# Patient Record
Sex: Female | Born: 1945 | Race: White | Hispanic: No | Marital: Married | Smoking: Never smoker
Health system: Southern US, Community
[De-identification: ages and names within clinical notes are randomized; demographics above are authoritative.]

## PROBLEM LIST (undated history)

## (undated) DIAGNOSIS — I82409 Acute embolism and thrombosis of unspecified deep veins of unspecified lower extremity: Secondary | ICD-10-CM

---

## 1997-11-13 ENCOUNTER — Other Ambulatory Visit: Admission: RE | Admit: 1997-11-13 | Discharge: 1997-11-13 | Payer: Self-pay | Admitting: Obstetrics & Gynecology

## 1997-12-06 ENCOUNTER — Ambulatory Visit (HOSPITAL_COMMUNITY): Admission: RE | Admit: 1997-12-06 | Discharge: 1997-12-06 | Payer: Self-pay | Admitting: Obstetrics & Gynecology

## 1997-12-06 ENCOUNTER — Encounter: Payer: Self-pay | Admitting: Obstetrics & Gynecology

## 1997-12-13 ENCOUNTER — Encounter: Payer: Self-pay | Admitting: Obstetrics & Gynecology

## 1997-12-13 ENCOUNTER — Ambulatory Visit (HOSPITAL_COMMUNITY): Admission: RE | Admit: 1997-12-13 | Discharge: 1997-12-13 | Payer: Self-pay | Admitting: Obstetrics & Gynecology

## 1998-10-24 ENCOUNTER — Ambulatory Visit (HOSPITAL_COMMUNITY): Admission: RE | Admit: 1998-10-24 | Discharge: 1998-10-24 | Payer: Self-pay | Admitting: Obstetrics & Gynecology

## 1998-11-18 ENCOUNTER — Other Ambulatory Visit: Admission: RE | Admit: 1998-11-18 | Discharge: 1998-11-18 | Payer: Self-pay | Admitting: Obstetrics & Gynecology

## 2000-01-04 ENCOUNTER — Other Ambulatory Visit: Admission: RE | Admit: 2000-01-04 | Discharge: 2000-01-04 | Payer: Self-pay | Admitting: Obstetrics & Gynecology

## 2003-02-19 ENCOUNTER — Other Ambulatory Visit: Admission: RE | Admit: 2003-02-19 | Discharge: 2003-02-19 | Payer: Self-pay | Admitting: Obstetrics & Gynecology

## 2003-02-20 ENCOUNTER — Encounter: Admission: RE | Admit: 2003-02-20 | Discharge: 2003-02-20 | Payer: Self-pay | Admitting: Geriatric Medicine

## 2003-03-20 ENCOUNTER — Encounter: Admission: RE | Admit: 2003-03-20 | Discharge: 2003-03-20 | Payer: Self-pay | Admitting: Geriatric Medicine

## 2003-12-25 ENCOUNTER — Ambulatory Visit (HOSPITAL_COMMUNITY): Admission: RE | Admit: 2003-12-25 | Discharge: 2003-12-25 | Payer: Self-pay | Admitting: Gastroenterology

## 2004-04-07 ENCOUNTER — Other Ambulatory Visit: Admission: RE | Admit: 2004-04-07 | Discharge: 2004-04-07 | Payer: Self-pay | Admitting: Obstetrics & Gynecology

## 2010-03-30 ENCOUNTER — Ambulatory Visit: Payer: BC Managed Care – PPO | Admitting: Physical Therapy

## 2010-03-31 ENCOUNTER — Encounter: Payer: Self-pay | Admitting: Physical Therapy

## 2010-04-02 ENCOUNTER — Encounter: Payer: Self-pay | Admitting: Physical Therapy

## 2013-06-21 ENCOUNTER — Other Ambulatory Visit: Payer: Self-pay | Admitting: Dermatology

## 2013-06-22 ENCOUNTER — Other Ambulatory Visit: Payer: Self-pay | Admitting: Geriatric Medicine

## 2013-06-22 ENCOUNTER — Ambulatory Visit
Admission: RE | Admit: 2013-06-22 | Discharge: 2013-06-22 | Disposition: A | Discharge status: Home / Self Care | Payer: Medicare Other | Source: Ambulatory Visit | Attending: Geriatric Medicine | Admitting: Geriatric Medicine

## 2013-06-22 DIAGNOSIS — M542 Cervicalgia: Secondary | ICD-10-CM

## 2013-06-22 DIAGNOSIS — R51 Headache: Secondary | ICD-10-CM

## 2013-07-03 ENCOUNTER — Ambulatory Visit
Admission: RE | Admit: 2013-07-03 | Discharge: 2013-07-03 | Disposition: A | Discharge status: Home / Self Care | Payer: Medicare Other | Source: Ambulatory Visit | Attending: Geriatric Medicine | Admitting: Geriatric Medicine

## 2013-07-03 DIAGNOSIS — R51 Headache: Secondary | ICD-10-CM

## 2013-07-03 MED ORDER — GADOBENATE DIMEGLUMINE 529 MG/ML IV SOLN
14.0000 mL | Freq: Once | INTRAVENOUS | Status: AC | PRN
  Administered 2013-07-03: 14 mL via INTRAVENOUS

## 2014-02-14 DIAGNOSIS — H93291 Other abnormal auditory perceptions, right ear: Secondary | ICD-10-CM | POA: Diagnosis not present

## 2014-02-14 DIAGNOSIS — H9311 Tinnitus, right ear: Secondary | ICD-10-CM | POA: Diagnosis not present

## 2014-02-14 DIAGNOSIS — J029 Acute pharyngitis, unspecified: Secondary | ICD-10-CM | POA: Diagnosis not present

## 2014-03-14 ENCOUNTER — Other Ambulatory Visit: Payer: Self-pay | Admitting: Obstetrics & Gynecology

## 2014-03-14 DIAGNOSIS — Z124 Encounter for screening for malignant neoplasm of cervix: Secondary | ICD-10-CM | POA: Diagnosis not present

## 2014-03-14 DIAGNOSIS — Z6824 Body mass index (BMI) 24.0-24.9, adult: Secondary | ICD-10-CM | POA: Diagnosis not present

## 2014-03-14 DIAGNOSIS — Z01419 Encounter for gynecological examination (general) (routine) without abnormal findings: Secondary | ICD-10-CM | POA: Diagnosis not present

## 2014-03-14 DIAGNOSIS — Z1231 Encounter for screening mammogram for malignant neoplasm of breast: Secondary | ICD-10-CM | POA: Diagnosis not present

## 2014-03-18 LAB — CYTOLOGY - PAP

## 2014-12-31 DIAGNOSIS — N858 Other specified noninflammatory disorders of uterus: Secondary | ICD-10-CM | POA: Diagnosis not present

## 2014-12-31 DIAGNOSIS — Z13820 Encounter for screening for osteoporosis: Secondary | ICD-10-CM | POA: Diagnosis not present

## 2014-12-31 DIAGNOSIS — Q782 Osteopetrosis: Secondary | ICD-10-CM | POA: Diagnosis not present

## 2014-12-31 DIAGNOSIS — E559 Vitamin D deficiency, unspecified: Secondary | ICD-10-CM | POA: Diagnosis not present

## 2014-12-31 DIAGNOSIS — N958 Other specified menopausal and perimenopausal disorders: Secondary | ICD-10-CM | POA: Diagnosis not present

## 2014-12-31 DIAGNOSIS — M818 Other osteoporosis without current pathological fracture: Secondary | ICD-10-CM | POA: Diagnosis not present

## 2015-01-31 DIAGNOSIS — Z23 Encounter for immunization: Secondary | ICD-10-CM | POA: Diagnosis not present

## 2015-03-09 DIAGNOSIS — M549 Dorsalgia, unspecified: Secondary | ICD-10-CM | POA: Diagnosis not present

## 2015-03-09 DIAGNOSIS — N159 Renal tubulo-interstitial disease, unspecified: Secondary | ICD-10-CM | POA: Diagnosis not present

## 2015-03-20 DIAGNOSIS — M810 Age-related osteoporosis without current pathological fracture: Secondary | ICD-10-CM | POA: Diagnosis not present

## 2015-03-20 DIAGNOSIS — Z1231 Encounter for screening mammogram for malignant neoplasm of breast: Secondary | ICD-10-CM | POA: Diagnosis not present

## 2015-03-26 ENCOUNTER — Other Ambulatory Visit: Payer: Self-pay | Admitting: Obstetrics & Gynecology

## 2015-03-26 DIAGNOSIS — R928 Other abnormal and inconclusive findings on diagnostic imaging of breast: Secondary | ICD-10-CM

## 2015-04-03 ENCOUNTER — Ambulatory Visit
Admission: RE | Admit: 2015-04-03 | Discharge: 2015-04-03 | Disposition: A | Discharge status: Home / Self Care | Payer: Medicare Other | Source: Ambulatory Visit | Attending: Obstetrics & Gynecology | Admitting: Obstetrics & Gynecology

## 2015-04-03 DIAGNOSIS — R921 Mammographic calcification found on diagnostic imaging of breast: Secondary | ICD-10-CM | POA: Diagnosis not present

## 2015-04-03 DIAGNOSIS — R928 Other abnormal and inconclusive findings on diagnostic imaging of breast: Secondary | ICD-10-CM

## 2015-06-18 ENCOUNTER — Other Ambulatory Visit: Payer: Self-pay | Admitting: Occupational Medicine

## 2015-06-18 ENCOUNTER — Ambulatory Visit: Payer: Self-pay

## 2015-06-18 DIAGNOSIS — M25521 Pain in right elbow: Secondary | ICD-10-CM

## 2015-07-28 DIAGNOSIS — R21 Rash and other nonspecific skin eruption: Secondary | ICD-10-CM | POA: Diagnosis not present

## 2015-07-28 DIAGNOSIS — L539 Erythematous condition, unspecified: Secondary | ICD-10-CM | POA: Diagnosis not present

## 2015-08-14 DIAGNOSIS — L989 Disorder of the skin and subcutaneous tissue, unspecified: Secondary | ICD-10-CM | POA: Diagnosis not present

## 2015-09-03 DIAGNOSIS — L814 Other melanin hyperpigmentation: Secondary | ICD-10-CM | POA: Diagnosis not present

## 2015-09-03 DIAGNOSIS — L812 Freckles: Secondary | ICD-10-CM | POA: Diagnosis not present

## 2015-09-03 DIAGNOSIS — L550 Sunburn of first degree: Secondary | ICD-10-CM | POA: Diagnosis not present

## 2015-09-03 DIAGNOSIS — L700 Acne vulgaris: Secondary | ICD-10-CM | POA: Diagnosis not present

## 2015-09-03 DIAGNOSIS — L821 Other seborrheic keratosis: Secondary | ICD-10-CM | POA: Diagnosis not present

## 2015-09-03 DIAGNOSIS — L570 Actinic keratosis: Secondary | ICD-10-CM | POA: Diagnosis not present

## 2015-09-03 DIAGNOSIS — L309 Dermatitis, unspecified: Secondary | ICD-10-CM | POA: Diagnosis not present

## 2015-09-03 DIAGNOSIS — L820 Inflamed seborrheic keratosis: Secondary | ICD-10-CM | POA: Diagnosis not present

## 2015-10-07 DIAGNOSIS — J3089 Other allergic rhinitis: Secondary | ICD-10-CM | POA: Diagnosis not present

## 2015-10-07 DIAGNOSIS — R0982 Postnasal drip: Secondary | ICD-10-CM | POA: Diagnosis not present

## 2015-12-17 DIAGNOSIS — Z23 Encounter for immunization: Secondary | ICD-10-CM | POA: Diagnosis not present

## 2015-12-17 DIAGNOSIS — T753XXA Motion sickness, initial encounter: Secondary | ICD-10-CM | POA: Diagnosis not present

## 2015-12-17 DIAGNOSIS — F411 Generalized anxiety disorder: Secondary | ICD-10-CM | POA: Diagnosis not present

## 2015-12-17 DIAGNOSIS — J301 Allergic rhinitis due to pollen: Secondary | ICD-10-CM | POA: Diagnosis not present

## 2017-02-14 DIAGNOSIS — Z23 Encounter for immunization: Secondary | ICD-10-CM | POA: Diagnosis not present

## 2017-03-28 ENCOUNTER — Ambulatory Visit: Payer: Medicare Other | Admitting: Nurse Practitioner

## 2017-03-28 DIAGNOSIS — R5383 Other fatigue: Secondary | ICD-10-CM | POA: Diagnosis not present

## 2017-03-28 DIAGNOSIS — J029 Acute pharyngitis, unspecified: Secondary | ICD-10-CM | POA: Diagnosis not present

## 2017-03-28 DIAGNOSIS — R51 Headache: Secondary | ICD-10-CM | POA: Diagnosis not present

## 2017-03-28 DIAGNOSIS — J309 Allergic rhinitis, unspecified: Secondary | ICD-10-CM | POA: Diagnosis not present

## 2017-07-06 ENCOUNTER — Other Ambulatory Visit: Payer: Self-pay | Admitting: Obstetrics & Gynecology

## 2017-07-06 DIAGNOSIS — Z1231 Encounter for screening mammogram for malignant neoplasm of breast: Secondary | ICD-10-CM

## 2017-08-04 ENCOUNTER — Ambulatory Visit
Admission: RE | Admit: 2017-08-04 | Discharge: 2017-08-04 | Disposition: A | Discharge status: Home / Self Care | Payer: Medicare Other | Source: Ambulatory Visit | Attending: Obstetrics & Gynecology | Admitting: Obstetrics & Gynecology

## 2017-08-04 DIAGNOSIS — Z1231 Encounter for screening mammogram for malignant neoplasm of breast: Secondary | ICD-10-CM

## 2017-08-05 DIAGNOSIS — D1801 Hemangioma of skin and subcutaneous tissue: Secondary | ICD-10-CM | POA: Diagnosis not present

## 2017-08-05 DIAGNOSIS — L400 Psoriasis vulgaris: Secondary | ICD-10-CM | POA: Diagnosis not present

## 2017-08-05 DIAGNOSIS — D485 Neoplasm of uncertain behavior of skin: Secondary | ICD-10-CM | POA: Diagnosis not present

## 2017-08-05 DIAGNOSIS — L821 Other seborrheic keratosis: Secondary | ICD-10-CM | POA: Diagnosis not present

## 2017-08-05 DIAGNOSIS — L814 Other melanin hyperpigmentation: Secondary | ICD-10-CM | POA: Diagnosis not present

## 2017-08-05 DIAGNOSIS — L570 Actinic keratosis: Secondary | ICD-10-CM | POA: Diagnosis not present

## 2017-08-09 DIAGNOSIS — Z6823 Body mass index (BMI) 23.0-23.9, adult: Secondary | ICD-10-CM | POA: Diagnosis not present

## 2017-08-09 DIAGNOSIS — Z124 Encounter for screening for malignant neoplasm of cervix: Secondary | ICD-10-CM | POA: Diagnosis not present

## 2018-01-07 DIAGNOSIS — Z23 Encounter for immunization: Secondary | ICD-10-CM | POA: Diagnosis not present

## 2018-01-24 DIAGNOSIS — Z13820 Encounter for screening for osteoporosis: Secondary | ICD-10-CM | POA: Diagnosis not present

## 2018-01-24 DIAGNOSIS — N858 Other specified noninflammatory disorders of uterus: Secondary | ICD-10-CM | POA: Diagnosis not present

## 2018-01-24 DIAGNOSIS — N958 Other specified menopausal and perimenopausal disorders: Secondary | ICD-10-CM | POA: Diagnosis not present

## 2018-01-24 DIAGNOSIS — M816 Localized osteoporosis [Lequesne]: Secondary | ICD-10-CM | POA: Diagnosis not present

## 2018-01-27 DIAGNOSIS — H2513 Age-related nuclear cataract, bilateral: Secondary | ICD-10-CM | POA: Diagnosis not present

## 2018-01-27 DIAGNOSIS — H5203 Hypermetropia, bilateral: Secondary | ICD-10-CM | POA: Diagnosis not present

## 2018-07-19 ENCOUNTER — Other Ambulatory Visit: Payer: Self-pay | Admitting: Obstetrics & Gynecology

## 2018-07-19 DIAGNOSIS — Z0100 Encounter for examination of eyes and vision without abnormal findings: Secondary | ICD-10-CM | POA: Diagnosis not present

## 2018-07-19 DIAGNOSIS — Z1231 Encounter for screening mammogram for malignant neoplasm of breast: Secondary | ICD-10-CM

## 2018-07-21 DIAGNOSIS — R69 Illness, unspecified: Secondary | ICD-10-CM | POA: Diagnosis not present

## 2018-08-03 DIAGNOSIS — R69 Illness, unspecified: Secondary | ICD-10-CM | POA: Diagnosis not present

## 2018-09-01 ENCOUNTER — Ambulatory Visit
Admission: RE | Admit: 2018-09-01 | Discharge: 2018-09-01 | Disposition: A | Discharge status: Home / Self Care | Payer: Medicare HMO | Source: Ambulatory Visit | Attending: Obstetrics & Gynecology | Admitting: Obstetrics & Gynecology

## 2018-09-01 ENCOUNTER — Other Ambulatory Visit: Payer: Self-pay

## 2018-09-01 DIAGNOSIS — Z1231 Encounter for screening mammogram for malignant neoplasm of breast: Secondary | ICD-10-CM

## 2018-09-05 ENCOUNTER — Other Ambulatory Visit: Payer: Self-pay | Admitting: Obstetrics & Gynecology

## 2018-09-05 DIAGNOSIS — R928 Other abnormal and inconclusive findings on diagnostic imaging of breast: Secondary | ICD-10-CM

## 2018-09-08 ENCOUNTER — Other Ambulatory Visit: Payer: Self-pay

## 2018-09-08 ENCOUNTER — Ambulatory Visit: Payer: Medicare HMO

## 2018-09-08 ENCOUNTER — Ambulatory Visit
Admission: RE | Admit: 2018-09-08 | Discharge: 2018-09-08 | Disposition: A | Discharge status: Home / Self Care | Payer: Medicare HMO | Source: Ambulatory Visit | Attending: Obstetrics & Gynecology | Admitting: Obstetrics & Gynecology

## 2018-09-08 DIAGNOSIS — R928 Other abnormal and inconclusive findings on diagnostic imaging of breast: Secondary | ICD-10-CM

## 2018-09-08 DIAGNOSIS — R922 Inconclusive mammogram: Secondary | ICD-10-CM | POA: Diagnosis not present

## 2018-11-22 ENCOUNTER — Ambulatory Visit
Admission: RE | Admit: 2018-11-22 | Discharge: 2018-11-22 | Disposition: A | Discharge status: Home / Self Care | Payer: Medicare HMO | Source: Ambulatory Visit | Attending: Geriatric Medicine | Admitting: Geriatric Medicine

## 2018-11-22 ENCOUNTER — Other Ambulatory Visit: Payer: Self-pay | Admitting: Geriatric Medicine

## 2018-11-22 DIAGNOSIS — M79671 Pain in right foot: Secondary | ICD-10-CM

## 2018-11-22 DIAGNOSIS — D369 Benign neoplasm, unspecified site: Secondary | ICD-10-CM | POA: Diagnosis not present

## 2018-11-22 DIAGNOSIS — W5501XA Bitten by cat, initial encounter: Secondary | ICD-10-CM | POA: Diagnosis not present

## 2018-11-22 DIAGNOSIS — S61259A Open bite of unspecified finger without damage to nail, initial encounter: Secondary | ICD-10-CM | POA: Diagnosis not present

## 2018-11-22 DIAGNOSIS — M810 Age-related osteoporosis without current pathological fracture: Secondary | ICD-10-CM | POA: Diagnosis not present

## 2018-11-22 DIAGNOSIS — Z23 Encounter for immunization: Secondary | ICD-10-CM | POA: Diagnosis not present

## 2019-01-29 DIAGNOSIS — Z780 Asymptomatic menopausal state: Secondary | ICD-10-CM | POA: Diagnosis not present

## 2019-01-29 DIAGNOSIS — M816 Localized osteoporosis [Lequesne]: Secondary | ICD-10-CM | POA: Diagnosis not present

## 2019-07-05 DIAGNOSIS — H2513 Age-related nuclear cataract, bilateral: Secondary | ICD-10-CM | POA: Diagnosis not present

## 2019-07-05 DIAGNOSIS — H524 Presbyopia: Secondary | ICD-10-CM | POA: Diagnosis not present

## 2019-07-09 DIAGNOSIS — R102 Pelvic and perineal pain: Secondary | ICD-10-CM | POA: Diagnosis not present

## 2019-07-09 DIAGNOSIS — R3982 Chronic bladder pain: Secondary | ICD-10-CM | POA: Diagnosis not present

## 2019-07-09 DIAGNOSIS — N841 Polyp of cervix uteri: Secondary | ICD-10-CM | POA: Diagnosis not present

## 2019-07-10 DIAGNOSIS — M810 Age-related osteoporosis without current pathological fracture: Secondary | ICD-10-CM | POA: Diagnosis not present

## 2019-07-10 DIAGNOSIS — Z0000 Encounter for general adult medical examination without abnormal findings: Secondary | ICD-10-CM | POA: Diagnosis not present

## 2019-07-10 DIAGNOSIS — Z1389 Encounter for screening for other disorder: Secondary | ICD-10-CM | POA: Diagnosis not present

## 2019-07-10 DIAGNOSIS — Z79899 Other long term (current) drug therapy: Secondary | ICD-10-CM | POA: Diagnosis not present

## 2019-07-10 DIAGNOSIS — Z13220 Encounter for screening for lipoid disorders: Secondary | ICD-10-CM | POA: Diagnosis not present

## 2019-07-10 DIAGNOSIS — R5383 Other fatigue: Secondary | ICD-10-CM | POA: Diagnosis not present

## 2019-07-12 DIAGNOSIS — Z1159 Encounter for screening for other viral diseases: Secondary | ICD-10-CM | POA: Diagnosis not present

## 2019-08-06 ENCOUNTER — Other Ambulatory Visit: Payer: Self-pay | Admitting: Obstetrics & Gynecology

## 2019-08-06 DIAGNOSIS — Z1231 Encounter for screening mammogram for malignant neoplasm of breast: Secondary | ICD-10-CM

## 2019-09-03 DIAGNOSIS — R3982 Chronic bladder pain: Secondary | ICD-10-CM | POA: Diagnosis not present

## 2019-09-03 DIAGNOSIS — Z124 Encounter for screening for malignant neoplasm of cervix: Secondary | ICD-10-CM | POA: Diagnosis not present

## 2019-09-03 DIAGNOSIS — Z6823 Body mass index (BMI) 23.0-23.9, adult: Secondary | ICD-10-CM | POA: Diagnosis not present

## 2019-09-10 ENCOUNTER — Ambulatory Visit
Admission: RE | Admit: 2019-09-10 | Discharge: 2019-09-10 | Disposition: A | Discharge status: Home / Self Care | Payer: Medicare Other | Source: Ambulatory Visit | Attending: Obstetrics & Gynecology | Admitting: Obstetrics & Gynecology

## 2019-09-10 ENCOUNTER — Other Ambulatory Visit: Payer: Self-pay

## 2019-09-10 DIAGNOSIS — Z1231 Encounter for screening mammogram for malignant neoplasm of breast: Secondary | ICD-10-CM | POA: Diagnosis not present

## 2019-11-20 DIAGNOSIS — Z23 Encounter for immunization: Secondary | ICD-10-CM | POA: Diagnosis not present

## 2020-06-16 DIAGNOSIS — L989 Disorder of the skin and subcutaneous tissue, unspecified: Secondary | ICD-10-CM | POA: Diagnosis not present

## 2020-07-03 DIAGNOSIS — M816 Localized osteoporosis [Lequesne]: Secondary | ICD-10-CM | POA: Diagnosis not present

## 2020-07-14 DIAGNOSIS — U071 COVID-19: Secondary | ICD-10-CM | POA: Diagnosis not present

## 2020-07-14 DIAGNOSIS — J069 Acute upper respiratory infection, unspecified: Secondary | ICD-10-CM | POA: Diagnosis not present

## 2020-07-23 DIAGNOSIS — Z79899 Other long term (current) drug therapy: Secondary | ICD-10-CM | POA: Diagnosis not present

## 2020-07-23 DIAGNOSIS — M8588 Other specified disorders of bone density and structure, other site: Secondary | ICD-10-CM | POA: Diagnosis not present

## 2020-07-31 DIAGNOSIS — H2513 Age-related nuclear cataract, bilateral: Secondary | ICD-10-CM | POA: Diagnosis not present

## 2020-07-31 DIAGNOSIS — H04123 Dry eye syndrome of bilateral lacrimal glands: Secondary | ICD-10-CM | POA: Diagnosis not present

## 2020-07-31 DIAGNOSIS — H5203 Hypermetropia, bilateral: Secondary | ICD-10-CM | POA: Diagnosis not present

## 2020-08-07 ENCOUNTER — Other Ambulatory Visit: Payer: Self-pay | Admitting: Obstetrics & Gynecology

## 2020-08-07 DIAGNOSIS — Z1231 Encounter for screening mammogram for malignant neoplasm of breast: Secondary | ICD-10-CM

## 2020-08-19 DIAGNOSIS — D1801 Hemangioma of skin and subcutaneous tissue: Secondary | ICD-10-CM | POA: Diagnosis not present

## 2020-08-19 DIAGNOSIS — D3613 Benign neoplasm of peripheral nerves and autonomic nervous system of lower limb, including hip: Secondary | ICD-10-CM | POA: Diagnosis not present

## 2020-08-19 DIAGNOSIS — L570 Actinic keratosis: Secondary | ICD-10-CM | POA: Diagnosis not present

## 2020-08-19 DIAGNOSIS — L309 Dermatitis, unspecified: Secondary | ICD-10-CM | POA: Diagnosis not present

## 2020-08-19 DIAGNOSIS — L814 Other melanin hyperpigmentation: Secondary | ICD-10-CM | POA: Diagnosis not present

## 2020-08-19 DIAGNOSIS — L821 Other seborrheic keratosis: Secondary | ICD-10-CM | POA: Diagnosis not present

## 2020-09-02 DIAGNOSIS — N644 Mastodynia: Secondary | ICD-10-CM | POA: Diagnosis not present

## 2020-09-02 DIAGNOSIS — F5104 Psychophysiologic insomnia: Secondary | ICD-10-CM | POA: Diagnosis not present

## 2020-09-02 DIAGNOSIS — Z79899 Other long term (current) drug therapy: Secondary | ICD-10-CM | POA: Diagnosis not present

## 2020-09-02 DIAGNOSIS — M810 Age-related osteoporosis without current pathological fracture: Secondary | ICD-10-CM | POA: Diagnosis not present

## 2020-09-02 DIAGNOSIS — Z0000 Encounter for general adult medical examination without abnormal findings: Secondary | ICD-10-CM | POA: Diagnosis not present

## 2020-09-02 DIAGNOSIS — Z136 Encounter for screening for cardiovascular disorders: Secondary | ICD-10-CM | POA: Diagnosis not present

## 2020-09-04 ENCOUNTER — Other Ambulatory Visit: Payer: Self-pay | Admitting: Geriatric Medicine

## 2020-09-04 DIAGNOSIS — N644 Mastodynia: Secondary | ICD-10-CM

## 2020-09-10 ENCOUNTER — Other Ambulatory Visit: Payer: Self-pay | Admitting: Geriatric Medicine

## 2020-09-10 DIAGNOSIS — N644 Mastodynia: Secondary | ICD-10-CM

## 2020-09-15 ENCOUNTER — Other Ambulatory Visit: Payer: Self-pay

## 2020-09-15 ENCOUNTER — Ambulatory Visit: Payer: Medicare Other

## 2020-09-15 ENCOUNTER — Ambulatory Visit
Admission: RE | Admit: 2020-09-15 | Discharge: 2020-09-15 | Disposition: A | Discharge status: Home / Self Care | Payer: Medicare Other | Source: Ambulatory Visit | Attending: Obstetrics & Gynecology | Admitting: Obstetrics & Gynecology

## 2020-09-15 DIAGNOSIS — R922 Inconclusive mammogram: Secondary | ICD-10-CM | POA: Diagnosis not present

## 2020-09-15 DIAGNOSIS — N644 Mastodynia: Secondary | ICD-10-CM

## 2020-10-02 ENCOUNTER — Ambulatory Visit: Payer: Medicare Other

## 2020-11-22 DIAGNOSIS — J069 Acute upper respiratory infection, unspecified: Secondary | ICD-10-CM | POA: Diagnosis not present

## 2020-11-22 DIAGNOSIS — R509 Fever, unspecified: Secondary | ICD-10-CM | POA: Diagnosis not present

## 2020-11-22 DIAGNOSIS — R059 Cough, unspecified: Secondary | ICD-10-CM | POA: Diagnosis not present

## 2020-11-22 DIAGNOSIS — Z03818 Encounter for observation for suspected exposure to other biological agents ruled out: Secondary | ICD-10-CM | POA: Diagnosis not present

## 2020-11-22 DIAGNOSIS — J101 Influenza due to other identified influenza virus with other respiratory manifestations: Secondary | ICD-10-CM | POA: Diagnosis not present

## 2021-01-09 ENCOUNTER — Other Ambulatory Visit: Payer: Self-pay | Admitting: Geriatric Medicine

## 2021-01-09 DIAGNOSIS — M810 Age-related osteoporosis without current pathological fracture: Secondary | ICD-10-CM

## 2021-01-09 DIAGNOSIS — R5383 Other fatigue: Secondary | ICD-10-CM | POA: Diagnosis not present

## 2021-01-09 DIAGNOSIS — Z79899 Other long term (current) drug therapy: Secondary | ICD-10-CM | POA: Diagnosis not present

## 2021-01-09 DIAGNOSIS — R052 Subacute cough: Secondary | ICD-10-CM | POA: Diagnosis not present

## 2021-01-09 DIAGNOSIS — D649 Anemia, unspecified: Secondary | ICD-10-CM | POA: Diagnosis not present

## 2021-01-09 DIAGNOSIS — M79605 Pain in left leg: Secondary | ICD-10-CM | POA: Diagnosis not present

## 2021-01-12 ENCOUNTER — Other Ambulatory Visit: Payer: Medicare Other

## 2021-01-12 DIAGNOSIS — H04123 Dry eye syndrome of bilateral lacrimal glands: Secondary | ICD-10-CM | POA: Diagnosis not present

## 2021-01-13 DIAGNOSIS — D508 Other iron deficiency anemias: Secondary | ICD-10-CM | POA: Diagnosis not present

## 2021-01-15 ENCOUNTER — Ambulatory Visit
Admission: RE | Admit: 2021-01-15 | Discharge: 2021-01-15 | Disposition: A | Discharge status: Home / Self Care | Payer: Medicare Other | Source: Ambulatory Visit | Attending: Geriatric Medicine | Admitting: Geriatric Medicine

## 2021-01-15 DIAGNOSIS — I82812 Embolism and thrombosis of superficial veins of left lower extremities: Secondary | ICD-10-CM | POA: Diagnosis not present

## 2021-01-15 DIAGNOSIS — M810 Age-related osteoporosis without current pathological fracture: Secondary | ICD-10-CM

## 2021-01-27 DIAGNOSIS — K2930 Chronic superficial gastritis without bleeding: Secondary | ICD-10-CM | POA: Diagnosis not present

## 2021-01-27 DIAGNOSIS — D509 Iron deficiency anemia, unspecified: Secondary | ICD-10-CM | POA: Diagnosis not present

## 2021-01-27 DIAGNOSIS — K222 Esophageal obstruction: Secondary | ICD-10-CM | POA: Diagnosis not present

## 2021-01-27 DIAGNOSIS — R1013 Epigastric pain: Secondary | ICD-10-CM | POA: Diagnosis not present

## 2021-01-27 DIAGNOSIS — K449 Diaphragmatic hernia without obstruction or gangrene: Secondary | ICD-10-CM | POA: Diagnosis not present

## 2021-01-30 ENCOUNTER — Other Ambulatory Visit: Payer: Self-pay | Admitting: Gastroenterology

## 2021-01-30 DIAGNOSIS — R1084 Generalized abdominal pain: Secondary | ICD-10-CM

## 2021-01-30 DIAGNOSIS — D508 Other iron deficiency anemias: Secondary | ICD-10-CM

## 2021-01-30 DIAGNOSIS — R194 Change in bowel habit: Secondary | ICD-10-CM

## 2021-02-02 ENCOUNTER — Other Ambulatory Visit: Payer: Self-pay | Admitting: Geriatric Medicine

## 2021-02-02 DIAGNOSIS — M79605 Pain in left leg: Secondary | ICD-10-CM

## 2021-02-02 DIAGNOSIS — F411 Generalized anxiety disorder: Secondary | ICD-10-CM | POA: Diagnosis not present

## 2021-02-03 ENCOUNTER — Ambulatory Visit
Admission: RE | Admit: 2021-02-03 | Discharge: 2021-02-03 | Disposition: A | Discharge status: Home / Self Care | Payer: Medicare Other | Source: Ambulatory Visit | Attending: Geriatric Medicine | Admitting: Geriatric Medicine

## 2021-02-03 DIAGNOSIS — M79605 Pain in left leg: Secondary | ICD-10-CM | POA: Diagnosis not present

## 2021-02-03 DIAGNOSIS — I82412 Acute embolism and thrombosis of left femoral vein: Secondary | ICD-10-CM | POA: Diagnosis not present

## 2021-02-03 DIAGNOSIS — I82442 Acute embolism and thrombosis of left tibial vein: Secondary | ICD-10-CM | POA: Diagnosis not present

## 2021-02-03 DIAGNOSIS — I824Z2 Acute embolism and thrombosis of unspecified deep veins of left distal lower extremity: Secondary | ICD-10-CM | POA: Diagnosis not present

## 2021-02-03 DIAGNOSIS — K2930 Chronic superficial gastritis without bleeding: Secondary | ICD-10-CM | POA: Diagnosis not present

## 2021-02-11 ENCOUNTER — Emergency Department (HOSPITAL_COMMUNITY): Payer: Medicare Other

## 2021-02-11 ENCOUNTER — Inpatient Hospital Stay (HOSPITAL_COMMUNITY): Payer: Medicare Other

## 2021-02-11 ENCOUNTER — Other Ambulatory Visit: Payer: Self-pay

## 2021-02-11 ENCOUNTER — Inpatient Hospital Stay (HOSPITAL_COMMUNITY)
Admission: EM | Admit: 2021-02-11 | Discharge: 2021-02-18 | DRG: 435 | Disposition: A | Discharge status: Home Health | Payer: Medicare Other | Attending: Internal Medicine | Admitting: Internal Medicine

## 2021-02-11 DIAGNOSIS — J90 Pleural effusion, not elsewhere classified: Secondary | ICD-10-CM | POA: Diagnosis not present

## 2021-02-11 DIAGNOSIS — Z515 Encounter for palliative care: Secondary | ICD-10-CM

## 2021-02-11 DIAGNOSIS — I82409 Acute embolism and thrombosis of unspecified deep veins of unspecified lower extremity: Secondary | ICD-10-CM | POA: Diagnosis not present

## 2021-02-11 DIAGNOSIS — I2609 Other pulmonary embolism with acute cor pulmonale: Secondary | ICD-10-CM | POA: Diagnosis not present

## 2021-02-11 DIAGNOSIS — Z7901 Long term (current) use of anticoagulants: Secondary | ICD-10-CM | POA: Diagnosis not present

## 2021-02-11 DIAGNOSIS — E43 Unspecified severe protein-calorie malnutrition: Secondary | ICD-10-CM | POA: Insufficient documentation

## 2021-02-11 DIAGNOSIS — I82402 Acute embolism and thrombosis of unspecified deep veins of left lower extremity: Secondary | ICD-10-CM | POA: Diagnosis not present

## 2021-02-11 DIAGNOSIS — D6869 Other thrombophilia: Secondary | ICD-10-CM | POA: Diagnosis present

## 2021-02-11 DIAGNOSIS — D630 Anemia in neoplastic disease: Secondary | ICD-10-CM | POA: Diagnosis present

## 2021-02-11 DIAGNOSIS — R160 Hepatomegaly, not elsewhere classified: Secondary | ICD-10-CM

## 2021-02-11 DIAGNOSIS — I82412 Acute embolism and thrombosis of left femoral vein: Secondary | ICD-10-CM

## 2021-02-11 DIAGNOSIS — M549 Dorsalgia, unspecified: Secondary | ICD-10-CM | POA: Diagnosis not present

## 2021-02-11 DIAGNOSIS — I2699 Other pulmonary embolism without acute cor pulmonale: Secondary | ICD-10-CM | POA: Diagnosis not present

## 2021-02-11 DIAGNOSIS — Z79899 Other long term (current) drug therapy: Secondary | ICD-10-CM

## 2021-02-11 DIAGNOSIS — C787 Secondary malignant neoplasm of liver and intrahepatic bile duct: Secondary | ICD-10-CM | POA: Diagnosis present

## 2021-02-11 DIAGNOSIS — D72829 Elevated white blood cell count, unspecified: Secondary | ICD-10-CM | POA: Diagnosis present

## 2021-02-11 DIAGNOSIS — C801 Malignant (primary) neoplasm, unspecified: Secondary | ICD-10-CM | POA: Diagnosis not present

## 2021-02-11 DIAGNOSIS — D649 Anemia, unspecified: Secondary | ICD-10-CM | POA: Diagnosis not present

## 2021-02-11 DIAGNOSIS — R4182 Altered mental status, unspecified: Secondary | ICD-10-CM | POA: Diagnosis not present

## 2021-02-11 DIAGNOSIS — Z6825 Body mass index (BMI) 25.0-25.9, adult: Secondary | ICD-10-CM | POA: Diagnosis not present

## 2021-02-11 DIAGNOSIS — C799 Secondary malignant neoplasm of unspecified site: Secondary | ICD-10-CM | POA: Diagnosis present

## 2021-02-11 DIAGNOSIS — F419 Anxiety disorder, unspecified: Secondary | ICD-10-CM | POA: Diagnosis present

## 2021-02-11 DIAGNOSIS — R109 Unspecified abdominal pain: Secondary | ICD-10-CM

## 2021-02-11 DIAGNOSIS — G893 Neoplasm related pain (acute) (chronic): Secondary | ICD-10-CM | POA: Diagnosis present

## 2021-02-11 DIAGNOSIS — Z86718 Personal history of other venous thrombosis and embolism: Secondary | ICD-10-CM | POA: Diagnosis not present

## 2021-02-11 DIAGNOSIS — Z7189 Other specified counseling: Secondary | ICD-10-CM | POA: Diagnosis not present

## 2021-02-11 DIAGNOSIS — R0602 Shortness of breath: Secondary | ICD-10-CM | POA: Diagnosis not present

## 2021-02-11 DIAGNOSIS — C229 Malignant neoplasm of liver, not specified as primary or secondary: Secondary | ICD-10-CM | POA: Diagnosis not present

## 2021-02-11 DIAGNOSIS — D376 Neoplasm of uncertain behavior of liver, gallbladder and bile ducts: Secondary | ICD-10-CM | POA: Diagnosis not present

## 2021-02-11 DIAGNOSIS — Z20822 Contact with and (suspected) exposure to covid-19: Secondary | ICD-10-CM | POA: Diagnosis present

## 2021-02-11 DIAGNOSIS — K869 Disease of pancreas, unspecified: Secondary | ICD-10-CM | POA: Diagnosis not present

## 2021-02-11 DIAGNOSIS — K5900 Constipation, unspecified: Secondary | ICD-10-CM | POA: Diagnosis present

## 2021-02-11 DIAGNOSIS — D75839 Thrombocytosis, unspecified: Secondary | ICD-10-CM | POA: Diagnosis not present

## 2021-02-11 DIAGNOSIS — R531 Weakness: Secondary | ICD-10-CM | POA: Diagnosis not present

## 2021-02-11 DIAGNOSIS — K7689 Other specified diseases of liver: Secondary | ICD-10-CM | POA: Diagnosis not present

## 2021-02-11 DIAGNOSIS — C259 Malignant neoplasm of pancreas, unspecified: Secondary | ICD-10-CM | POA: Diagnosis present

## 2021-02-11 DIAGNOSIS — K8689 Other specified diseases of pancreas: Secondary | ICD-10-CM | POA: Diagnosis not present

## 2021-02-11 DIAGNOSIS — R300 Dysuria: Secondary | ICD-10-CM | POA: Diagnosis present

## 2021-02-11 DIAGNOSIS — R627 Adult failure to thrive: Secondary | ICD-10-CM | POA: Diagnosis present

## 2021-02-11 DIAGNOSIS — R1110 Vomiting, unspecified: Secondary | ICD-10-CM | POA: Diagnosis not present

## 2021-02-11 HISTORY — DX: Acute embolism and thrombosis of unspecified deep veins of unspecified lower extremity: I82.409

## 2021-02-11 LAB — COMPREHENSIVE METABOLIC PANEL
ALT: 59 U/L — ABNORMAL HIGH (ref 0–44)
AST: 61 U/L — ABNORMAL HIGH (ref 15–41)
Albumin: 2.7 g/dL — ABNORMAL LOW (ref 3.5–5.0)
Alkaline Phosphatase: 413 U/L — ABNORMAL HIGH (ref 38–126)
Anion gap: 17 — ABNORMAL HIGH (ref 5–15)
BUN: 11 mg/dL (ref 8–23)
CO2: 23 mmol/L (ref 22–32)
Calcium: 9.7 mg/dL (ref 8.9–10.3)
Chloride: 95 mmol/L — ABNORMAL LOW (ref 98–111)
Creatinine, Ser: 0.55 mg/dL (ref 0.44–1.00)
GFR, Estimated: >60 mL/min (ref >60)
Glucose, Bld: 116 mg/dL — ABNORMAL HIGH (ref 70–99)
Potassium: 3.9 mmol/L (ref 3.5–5.1)
Sodium: 135 mmol/L (ref 135–145)
Total Bilirubin: 0.9 mg/dL (ref 0.3–1.2)
Total Protein: 6.8 g/dL (ref 6.5–8.1)

## 2021-02-11 LAB — CBC WITH DIFFERENTIAL/PLATELET
Abs Immature Granulocytes: 0.17 10*3/uL — ABNORMAL HIGH (ref 0.00–0.07)
Basophils Absolute: 0 10*3/uL (ref 0.0–0.1)
Basophils Relative: 0 %
Eosinophils Absolute: 0 10*3/uL (ref 0.0–0.5)
Eosinophils Relative: 0 %
HCT: 29.5 % — ABNORMAL LOW (ref 36.0–46.0)
Hemoglobin: 9.2 g/dL — ABNORMAL LOW (ref 12.0–15.0)
Immature Granulocytes: 1 %
Lymphocytes Relative: 12 %
Lymphs Abs: 2.3 10*3/uL (ref 0.7–4.0)
MCH: 27.8 pg (ref 26.0–34.0)
MCHC: 31.2 g/dL (ref 30.0–36.0)
MCV: 89.1 fL (ref 80.0–100.0)
Monocytes Absolute: 1.3 10*3/uL — ABNORMAL HIGH (ref 0.1–1.0)
Monocytes Relative: 7 %
Neutro Abs: 15.6 10*3/uL — ABNORMAL HIGH (ref 1.7–7.7)
Neutrophils Relative %: 80 %
Platelets: 542 10*3/uL — ABNORMAL HIGH (ref 150–400)
RBC: 3.31 MIL/uL — ABNORMAL LOW (ref 3.87–5.11)
RDW: 13.4 % (ref 11.5–15.5)
WBC: 19.4 10*3/uL — ABNORMAL HIGH (ref 4.0–10.5)
nRBC: 0 % (ref 0.0–0.2)

## 2021-02-11 LAB — URINALYSIS, ROUTINE W REFLEX MICROSCOPIC
Bilirubin Urine: NEGATIVE
Glucose, UA: NEGATIVE mg/dL
Ketones, ur: 40 mg/dL — AB
Leukocytes,Ua: NEGATIVE
Nitrite: NEGATIVE
Protein, ur: NEGATIVE mg/dL
Specific Gravity, Urine: 1.01 (ref 1.005–1.030)
pH: 8.5 — ABNORMAL HIGH (ref 5.0–8.0)

## 2021-02-11 LAB — RESP PANEL BY RT-PCR (FLU A&B, COVID) ARPGX2
Influenza A by PCR: NEGATIVE
Influenza B by PCR: NEGATIVE
SARS Coronavirus 2 by RT PCR: NEGATIVE

## 2021-02-11 LAB — MAGNESIUM: Magnesium: 2 mg/dL (ref 1.7–2.4)

## 2021-02-11 LAB — URINALYSIS, MICROSCOPIC (REFLEX)

## 2021-02-11 LAB — TROPONIN I (HIGH SENSITIVITY)
Troponin I (High Sensitivity): 8 ng/L (ref <18)
Troponin I (High Sensitivity): 8 ng/L (ref <18)

## 2021-02-11 LAB — LIPASE, BLOOD: Lipase: 32 U/L (ref 11–51)

## 2021-02-11 MED ORDER — POLYETHYLENE GLYCOL 3350 17 G PO PACK: 17.0000 g | PACK | Freq: Every day | ORAL | Status: DC | PRN

## 2021-02-11 MED ORDER — ACETAMINOPHEN 325 MG PO TABS
650.0000 mg | ORAL_TABLET | Freq: Four times a day (QID) | ORAL | Status: DC | PRN
  Administered 2021-02-12: 650 mg via ORAL
  Filled 2021-02-11: qty 2

## 2021-02-11 MED ORDER — ACETAMINOPHEN 650 MG RE SUPP: 650.0000 mg | Freq: Four times a day (QID) | RECTAL | Status: DC | PRN

## 2021-02-11 MED ORDER — ALPRAZOLAM 0.25 MG PO TABS
0.2500 mg | ORAL_TABLET | Freq: Three times a day (TID) | ORAL | Status: DC | PRN
  Administered 2021-02-11 – 2021-02-17 (×6): 0.25 mg via ORAL
  Filled 2021-02-11 (×6): qty 1

## 2021-02-11 MED ORDER — SODIUM CHLORIDE 0.9% FLUSH
3.0000 mL | Freq: Two times a day (BID) | INTRAVENOUS | Status: DC
  Administered 2021-02-12 – 2021-02-18 (×8): 3 mL via INTRAVENOUS

## 2021-02-11 MED ORDER — SODIUM CHLORIDE 0.9 % IV SOLN: INTRAVENOUS | Status: DC

## 2021-02-11 MED ORDER — ONDANSETRON HCL 4 MG/2ML IJ SOLN
4.0000 mg | Freq: Once | INTRAMUSCULAR | Status: AC
  Administered 2021-02-11: 4 mg via INTRAVENOUS
  Filled 2021-02-11: qty 2

## 2021-02-11 MED ORDER — HYDROMORPHONE HCL 1 MG/ML IJ SOLN
1.0000 mg | Freq: Once | INTRAMUSCULAR | Status: AC
  Administered 2021-02-11: 1 mg via INTRAVENOUS
  Filled 2021-02-11: qty 1

## 2021-02-11 MED ORDER — ONDANSETRON HCL 4 MG PO TABS
4.0000 mg | ORAL_TABLET | Freq: Four times a day (QID) | ORAL | Status: DC | PRN
  Administered 2021-02-15: 4 mg via ORAL
  Filled 2021-02-11: qty 1

## 2021-02-11 MED ORDER — IOHEXOL 350 MG/ML SOLN
100.0000 mL | Freq: Once | INTRAVENOUS | Status: AC | PRN
  Administered 2021-02-11: 100 mL via INTRAVENOUS

## 2021-02-11 MED ORDER — HEPARIN BOLUS VIA INFUSION
2000.0000 [IU] | Freq: Once | INTRAVENOUS | Status: AC
  Administered 2021-02-11: 2000 [IU] via INTRAVENOUS
  Filled 2021-02-11: qty 2000

## 2021-02-11 MED ORDER — ONDANSETRON HCL 4 MG/2ML IJ SOLN
4.0000 mg | Freq: Four times a day (QID) | INTRAMUSCULAR | Status: DC | PRN
  Administered 2021-02-12: 4 mg via INTRAVENOUS
  Filled 2021-02-11 (×2): qty 2

## 2021-02-11 MED ORDER — HYDROMORPHONE HCL 1 MG/ML IJ SOLN
0.5000 mg | INTRAMUSCULAR | Status: AC | PRN
  Administered 2021-02-11 – 2021-02-12 (×8): 1 mg via INTRAVENOUS
  Filled 2021-02-11 (×8): qty 1

## 2021-02-11 MED ORDER — HEPARIN (PORCINE) 25000 UT/250ML-% IV SOLN
1250.0000 [IU]/h | INTRAVENOUS | Status: DC
  Administered 2021-02-11: 900 [IU]/h via INTRAVENOUS
  Administered 2021-02-12: 1100 [IU]/h via INTRAVENOUS
  Filled 2021-02-11 (×2): qty 250

## 2021-02-11 MED ORDER — SODIUM CHLORIDE 0.9 % IV BOLUS
1000.0000 mL | Freq: Once | INTRAVENOUS | Status: AC
  Administered 2021-02-11: 1000 mL via INTRAVENOUS

## 2021-02-11 NOTE — H&P (Addendum)
History and Physical    Angie Rojas DOB: [birth year] DOA: 02/11/2021  PCP: Lajean Manes, MD  Patient coming from: Home  I have personally briefly reviewed patient's old medical records in [place]  Chief Complaint: Abdominal pain  HPI: Angie Rojas is a 76 y.o. female with medical history significant of recent left femoral vein DVT about a week ago on Eliquis presented to ED with complaint of abdominal pain.  History was provided by the patient and supplemented by the son [name] who is at the bedside.  Per history, patient has been having abdominal pain for about 3 to 4 weeks.  Pain has been intermittent but lately more constant, radiating to the back, severe 10 out of 10, relieved only a little with some pain medications and has no aggravating factors.  She describes at crampy.  She also has intermittent shortness of breath and chest pain however on my encounter today, she denies any of those at the moment.  She saw her PCP about a week ago and was diagnosed with DVT and was started on Eliquis.  At baseline, patient is very anxious person by personality, verified by the son at the bedside.  Denies any fever, chills, cough, any problem with bowel movements, nausea or vomiting.  Per son, she is fully vaccinated and boosted for COVID-19.  Patient currently is very anxious.  ED Course: Upon arrival to ED, she was tachycardic.  Chest x-ray negative for any acute pathology.  CT angiogram of chest showed acute PE in segmental and subsegmental branches in the right lower lobe, dilatation of right ventricular cavity suggesting possible right ventricular strain.  No aortic dissection.  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  Borderline retroperitoneal lymph nodes.  Fibroid.  Patient was given some pain medications and was started on heparin drip and hospital service were consulted for  admission.  COVID test pending.  Review of Systems: As per HPI otherwise negative.    No past medical history on file.  Patient does not have any past medical history other than DVT.  No past surgical history on file.  Does not have any past surgical history.   has no history on file for tobacco use, alcohol use, and drug use.  Patient has never smoked or used any drug.  Social alcohol drinker.  No family history on file.  Patient's father died of bladder cancer.  Prior to Admission medications   Not on File    Physical Exam: Vitals:   02/11/21 1641 02/11/21 1641 02/11/21 1700 02/11/21 1730  BP: 130/60 130/60 131/71 123/75  Pulse: (!) 110 (!) 106 (!) 112 (!) 104  Resp: 18 20 (!) 23 20  Temp:      TempSrc:      SpO2: 100% 100% 99% 100%  Weight:      Height:        Constitutional: NAD, calm, comfortable Vitals:   02/11/21 1641 02/11/21 1641 02/11/21 1700 02/11/21 1730  BP: 130/60 130/60 131/71 123/75  Pulse: (!) 110 (!) 106 (!) 112 (!) 104  Resp: 18 20 (!) 23 20  Temp:      TempSrc:      SpO2: 100% 100% 99% 100%  Weight:      Height:       Eyes: PERRL, lids and conjunctivae normal ENMT: Mucous membranes are moist. Posterior pharynx clear of any exudate or lesions.Normal dentition.  Neck: normal,  supple, no masses, no thyromegaly Respiratory: clear to auscultation bilaterally, no wheezing, no crackles. Normal respiratory effort. No accessory muscle use.  Cardiovascular: Regular rate and rhythm, no murmurs / rubs / gallops. No extremity edema. 2+ pedal pulses. No carotid bruits.  Abdomen: Mild generalized tenderness, no masses palpated. No hepatosplenomegaly. Bowel sounds positive.  Musculoskeletal: no clubbing / cyanosis. No joint deformity upper and lower extremities. Good ROM, no contractures. Normal muscle tone.  Skin: no rashes, lesions, ulcers. No induration Neurologic: CN 2-12 grossly intact. Sensation intact, DTR normal. Strength 5/5 in all 4.  Psychiatric: .  Alert and oriented x 3.  Anxious.   Labs on Admission: I have personally reviewed following labs and imaging studies  CBC: Recent Labs  Lab 02/11/21 1324  WBC 19.4*  NEUTROABS 15.6*  HGB 9.2*  HCT 29.5*  MCV 89.1  PLT 829*   Basic Metabolic Panel: Recent Labs  Lab 02/11/21 1324  NA 135  K 3.9  CL 95*  CO2 23  GLUCOSE 116*  BUN 11  CREATININE 0.55  CALCIUM 9.7   GFR: Estimated Creatinine Clearance: 45 mL/min (by C-G formula based on SCr of 0.55 mg/dL). Liver Function Tests: Recent Labs  Lab 02/11/21 1324  AST 61*  ALT 59*  ALKPHOS 413*  BILITOT 0.9  PROT 6.8  ALBUMIN 2.7*   Recent Labs  Lab 02/11/21 1324  LIPASE 32   No results for input(s): AMMONIA in the last 168 hours. Coagulation Profile: No results for input(s): INR, PROTIME in the last 168 hours. Cardiac Enzymes: No results for input(s): CKTOTAL, CKMB, CKMBINDEX, TROPONINI in the last 168 hours. BNP (last 3 results) No results for input(s): PROBNP in the last 8760 hours. HbA1C: No results for input(s): HGBA1C in the last 72 hours. CBG: No results for input(s): GLUCAP in the last 168 hours. Lipid Profile: No results for input(s): CHOL, HDL, LDLCALC, TRIG, CHOLHDL, LDLDIRECT in the last 72 hours. Thyroid Function Tests: No results for input(s): TSH, T4TOTAL, FREET4, T3FREE, THYROIDAB in the last 72 hours. Anemia Panel: No results for input(s): VITAMINB12, FOLATE, FERRITIN, TIBC, IRON, RETICCTPCT in the last 72 hours. Urine analysis:    Component Value Date/Time   COLORURINE YELLOW 02/11/2021 1254   APPEARANCEUR CLEAR 02/11/2021 1254   LABSPEC 1.010 02/11/2021 1254   PHURINE 8.5 (H) 02/11/2021 1254   GLUCOSEU NEGATIVE 02/11/2021 1254   HGBUR TRACE (A) 02/11/2021 1254   BILIRUBINUR NEGATIVE 02/11/2021 1254   KETONESUR 40 (A) 02/11/2021 1254   PROTEINUR NEGATIVE 02/11/2021 1254   NITRITE NEGATIVE 02/11/2021 1254   LEUKOCYTESUR NEGATIVE 02/11/2021 1254    Radiological Exams on  Admission: DG Chest 2 View  Result Date: 02/11/2021 CLINICAL DATA:  Shortness of breath and vomiting.  DVT. EXAM: CHEST - 2 VIEW COMPARISON:  None. FINDINGS: Trachea is midline. Heart size normal. Lungs are clear. No pleural fluid. IMPRESSION: No acute findings. Electronically Signed   By: Lorin Picket M.D.   On: 02/11/2021 13:50   CT Angio Chest PE W and/or Wo Contrast  Result Date: 02/11/2021 CLINICAL DATA:  Shortness of breath, history of DVT in the left lower extremity EXAM: CT ANGIOGRAPHY CHEST WITH CONTRAST TECHNIQUE: Multidetector CT imaging of the chest was performed using the standard protocol during bolus administration of intravenous contrast. Multiplanar CT image reconstructions and MIPs were obtained to evaluate the vascular anatomy. RADIATION DOSE REDUCTION: This exam was performed according to the departmental dose-optimization program which includes automated exposure control, adjustment of the mA and/or kV according to patient  size and/or use of iterative reconstruction technique. CONTRAST:  171mL OMNIPAQUE IOHEXOL 350 MG/ML SOLN COMPARISON:  None. FINDINGS: Cardiovascular: Contrast density in the pulmonary artery branches is less than optimal. There is more contrast enhancement in the systemic vessels suggesting less than optimal timing of the imaging sequence. As far as seen, there are no filling defects in central pulmonary artery branches. There are few intraluminal filling defects in the segmental and subsegmental branches in the right lower lung fields with small thrombus burden. There is no ectasia of main pulmonary artery. Right ventricular cavity is more prominent than usual suggesting possible right ventricular strain. Midportion of right ventricular cavity measures 3.7 cm in diameter. Mediastinum/Nodes: No significant lymphadenopathy seen. Lungs/Pleura: There is no focal pulmonary consolidation. There is no pleural effusion or pneumothorax. Small linear densities in the lower  lung fields suggest scarring or subsegmental atelectasis. Upper Abdomen: There are numerous space-occupying lesions of varying sizes in the visualized upper portions of liver measuring up to 5.3 cm in diameter. There is fatty infiltration in the liver. Musculoskeletal: Unremarkable. Review of the MIP images confirms the above findings. IMPRESSION: Acute pulmonary embolism in few segmental and subsegmental branches in the right lower lobe. Small thrombus burden. There is dilation of right ventricular cavity suggesting possible right ventricular strain. There is no evidence of thoracic aortic dissection. There is no focal pulmonary consolidation. There are numerous space-occupying lesions of varying sizes and varying attenuation in the liver suggesting extensive hepatic metastatic disease. Fatty infiltration is seen in the liver. Electronically Signed   By: Elmer Picker M.D.   On: 02/11/2021 16:02   CT ABDOMEN PELVIS W CONTRAST  Result Date: 02/11/2021 CLINICAL DATA:  Abdominal pain. EXAM: CT ABDOMEN AND PELVIS WITH CONTRAST TECHNIQUE: Multidetector CT imaging of the abdomen and pelvis was performed using the standard protocol following bolus administration of intravenous contrast. RADIATION DOSE REDUCTION: This exam was performed according to the departmental dose-optimization program which includes automated exposure control, adjustment of the mA and/or kV according to patient size and/or use of iterative reconstruction technique. CONTRAST:  167mL OMNIPAQUE IOHEXOL 350 MG/ML SOLN COMPARISON:  None. FINDINGS: Lower chest: Free motion artifact but no pulmonary lesions or pleural effusions. 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 Largest lesion in the left hepatic lobe on image number 18/3 measures 4.4 cm. Index lesion in the right hepatic lobe on image 35/3 measures 4.1 cm. The gallbladder is grossly normal.  No common bile duct dilatation. Pancreas: There is a  2.6 cm lesion in the pancreatic tail which could be the primary neoplasm. There is also a small lesion in the body tail junction region on image 32/3 which measures 10 mm. Spleen: Normal size.  No splenic lesions. Adrenals/Urinary Tract: No adrenal gland lesions. Small renal cysts. No worrisome renal lesions. The bladder is unremarkable. Stomach/Bowel: The stomach, duodenum, small bowel and terminal ileum are grossly normal. No acute inflammatory process, mass lesions or obstructive findings. No obvious colonic lesions to suggest a colonic primary. The appendix is normal. Vascular/Lymphatic: The aorta and branch vessels are patent. Scattered atherosclerotic calcifications. 2.2 cm the celiac axis lymph node on image 30/3. There are scattered borderline retroperitoneal lymph nodes without overt adenopathy. Nodular mesenteric implant on image 55/3 measures 2.1 cm. Mesenteric implant on image 57/3 measures 18 mm. Reproductive: Calcified right-sided fibroid. There is also a E 3.7 cm mass which could be a partially degenerated fibroid. Could not exclude the possibility of endometrial cancer. Pelvic ultrasound or MRI  pelvis may be helpful for further evaluation. No adnexal masses. Prominent gonadal veins bilaterally. Other: Small amount of free pelvic fluid is noted. No inguinal adenopathy. Musculoskeletal: No significant bony findings. IMPRESSION: 1. Diffuse hepatic metastatic disease. 2. 2.6 cm lesion in the pancreatic tail could be the primary neoplasm. However there is a second lesion in the pancreatic body/tail junction region and this could reflect metastatic disease also. 3. Celiac axis lymphadenopathy and mesenteric implants. 4. Borderline retroperitoneal lymph nodes. 5. 3.7 cm mass in the uterus could be a partially degenerated fibroid. Could not exclude the possibility of any medial cancer. PET-CT may be helpful to evaluate all of the above findings and to potentially guide a safe and appropriate biopsy.  Electronically Signed   By: Marijo Sanes M.D.   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  I am going to check CA 19-9 as well as CEA.  Patient will need biopsy for the definitive diagnosis.  I have consulted IR for that.  I will start her on appropriate pain medications.  We will start on clear liquid diet as well as IV fluids.  Will eventually need oncology consult once pathology report available.  Elevated LFTs: Secondary to liver metastasis.  Chest pain left lower extremity DVT/right lower lobe PE: We will continue heparin for now as it appears that patient may have failed Eliquis.  Troponin negative.  Will order transthoracic echo.  Leukocytosis: Likely reactive due to cancer.  I doubt any infection.  Chest x-ray negative.  UA still pending.  Patient has no urinary complaints.  Anemia: Hemoglobin 9.2.  Previous hemoglobin not available in the chart.  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  Repeat CBC in the morning.  Anxiety: We will start on as needed Xanax.  DVT prophylaxis:   Heparin Code Status: Full code Family Communication: Son present at bedside.  Plan of care discussed with patient in length and he verbalized understanding and agreed with it. Disposition Plan: Home when full work-up and plan formulated. Consults called: IR  Darliss Cheney MD [facility]  02/11/2021, 5:47 PM  To contact the attending provider between 7A-7P or the covering provider during after hours 7P-7A, please log into the web site [email or web address]

## 2021-02-11 NOTE — ED Notes (Signed)
Pt returned from CT °

## 2021-02-11 NOTE — ED Provider Notes (Signed)
[facility] EMERGENCY DEPARTMENT Provider Note   CSN: [id number] Arrival date & time: 02/11/21  1254     History  Chief Complaint  Patient presents with   Shortness of Breath    Angie Rojas is a 76 y.o. female presented emergency department with diffuse abdominal pain and shortness of breath.  The patient is a poor historian, her son at bedside provides additional history.  He reports that she has been complaining of diffuse body pain for several weeks.  Her doctors been trying to work her up as an outpatient.  She was diagnosed with a DVT last week and has been started on Eliquis.  She is also had intermittent shortness of breath and chest pain, of unclear onset.  She describes sharp pain in her right upper side that radiates towards her back and up towards her shoulder.  She is extremely anxious, frequently repeating herself on exam.  Her son says this is the way she has been behaving for several weeks.  HPI     Home Medications Prior to Admission medications   Not on File      Allergies    Patient has no allergy information on record.    Review of Systems   Review of Systems  Physical Exam Updated Vital Signs BP 123/75    Pulse (!) 104    Temp 98.8 F (37.1 C) (Oral)    Resp 20    Ht 5' (1.524 m)    Wt 54.4 kg    SpO2 100%    BMI 23.44 kg/m  Physical Exam Constitutional:      General: She is not in acute distress. HENT:     Head: Normocephalic and atraumatic.  Eyes:     Conjunctiva/sclera: Conjunctivae normal.     Pupils: Pupils are equal, round, and reactive to light.  Cardiovascular:     Rate and Rhythm: Normal rate and regular rhythm.  Pulmonary:     Effort: Pulmonary effort is normal. No respiratory distress.  Abdominal:     General: There is no distension.     Tenderness: There is abdominal tenderness in the right upper quadrant.  Skin:    General: Skin is warm and dry.  Neurological:     General: No focal deficit present.      Mental Status: She is alert. Mental status is at baseline.  Psychiatric:        Mood and Affect: Mood normal.        Behavior: Behavior normal.    ED Results / Procedures / Treatments   Labs (all labs ordered are listed, but only abnormal results are displayed) Labs Reviewed  COMPREHENSIVE METABOLIC PANEL - Abnormal; Notable for the following components:      Result Value   Chloride 95 (*)    Glucose, Bld 116 (*)    Albumin 2.7 (*)    AST 61 (*)    ALT 59 (*)    Alkaline Phosphatase 413 (*)    Anion gap 17 (*)    All other components within normal limits  CBC WITH DIFFERENTIAL/PLATELET - Abnormal; Notable for the following components:   WBC 19.4 (*)    RBC 3.31 (*)    Hemoglobin 9.2 (*)    HCT 29.5 (*)    Platelets 542 (*)    Neutro Abs 15.6 (*)    Monocytes Absolute 1.3 (*)    Abs Immature Granulocytes 0.17 (*)    All other components within normal limits  URINALYSIS,  ROUTINE W REFLEX MICROSCOPIC - Abnormal; Notable for the following components:   pH 8.5 (*)    Hgb urine dipstick TRACE (*)    Ketones, ur 40 (*)    All other components within normal limits  URINALYSIS, MICROSCOPIC (REFLEX) - Abnormal; Notable for the following components:   Bacteria, UA RARE (*)    All other components within normal limits  RESP PANEL BY RT-PCR (FLU A&B, COVID) ARPGX2  LIPASE, BLOOD  APTT  HEPARIN LEVEL (UNFRACTIONATED)  CBC  MAGNESIUM  TSH  COMPREHENSIVE METABOLIC PANEL  CANCER ANTIGEN 19-9  CEA  TROPONIN I (HIGH SENSITIVITY)  TROPONIN I (HIGH SENSITIVITY)    EKG None  Radiology DG Chest 2 View  Result Date: 02/11/2021 CLINICAL DATA:  Shortness of breath and vomiting.  DVT. EXAM: CHEST - 2 VIEW COMPARISON:  None. FINDINGS: Trachea is midline. Heart size normal. Lungs are clear. No pleural fluid. IMPRESSION: No acute findings. Electronically Signed   By: Lorin Picket M.D.   On: 02/11/2021 13:50   CT Head Wo Contrast  Result Date: 02/11/2021 CLINICAL DATA:  Mental  status change.  Rule out metastatic disease EXAM: CT HEAD WITHOUT CONTRAST TECHNIQUE: Contiguous axial images were obtained from the base of the skull through the vertex without intravenous contrast. RADIATION DOSE REDUCTION: This exam was performed according to the departmental dose-optimization program which includes automated exposure control, adjustment of the mA and/or kV according to patient size and/or use of iterative reconstruction technique. COMPARISON:  MRI head 07/03/2013 FINDINGS: Brain: No evidence of acute infarction, hemorrhage, hydrocephalus, extra-axial collection or mass lesion/mass effect. Vascular: Negative for hyperdense vessel Skull: Negative Sinuses/Orbits: Paranasal sinuses clear.  Negative orbit Other: None IMPRESSION: Negative CT head Electronically Signed   By: Franchot Gallo M.D.   On: 02/11/2021 18:07   CT Angio Chest PE W and/or Wo Contrast  Result Date: 02/11/2021 CLINICAL DATA:  Shortness of breath, history of DVT in the left lower extremity EXAM: CT ANGIOGRAPHY CHEST WITH CONTRAST TECHNIQUE: Multidetector CT imaging of the chest was performed using the standard protocol during bolus administration of intravenous contrast. Multiplanar CT image reconstructions and MIPs were obtained to evaluate the vascular anatomy. RADIATION DOSE REDUCTION: This exam was performed according to the departmental dose-optimization program which includes automated exposure control, adjustment of the mA and/or kV according to patient size and/or use of iterative reconstruction technique. CONTRAST:  168mL OMNIPAQUE IOHEXOL 350 MG/ML SOLN COMPARISON:  None. FINDINGS: Cardiovascular: Contrast density in the pulmonary artery branches is less than optimal. There is more contrast enhancement in the systemic vessels suggesting less than optimal timing of the imaging sequence. As far as seen, there are no filling defects in central pulmonary artery branches. There are few intraluminal filling defects in the  segmental and subsegmental branches in the right lower lung fields with small thrombus burden. There is no ectasia of main pulmonary artery. Right ventricular cavity is more prominent than usual suggesting possible right ventricular strain. Midportion of right ventricular cavity measures 3.7 cm in diameter. Mediastinum/Nodes: No significant lymphadenopathy seen. Lungs/Pleura: There is no focal pulmonary consolidation. There is no pleural effusion or pneumothorax. Small linear densities in the lower lung fields suggest scarring or subsegmental atelectasis. Upper Abdomen: There are numerous space-occupying lesions of varying sizes in the visualized upper portions of liver measuring up to 5.3 cm in diameter. There is fatty infiltration in the liver. Musculoskeletal: Unremarkable. Review of the MIP images confirms the above findings. IMPRESSION: Acute pulmonary embolism in few segmental and subsegmental  branches in the right lower lobe. Small thrombus burden. There is dilation of right ventricular cavity suggesting possible right ventricular strain. There is no evidence of thoracic aortic dissection. There is no focal pulmonary consolidation. There are numerous space-occupying lesions of varying sizes and varying attenuation in the liver suggesting extensive hepatic metastatic disease. Fatty infiltration is seen in the liver. Electronically Signed   By: Elmer Picker M.D.   On: 02/11/2021 16:02   CT ABDOMEN PELVIS W CONTRAST  Result Date: 02/11/2021 CLINICAL DATA:  Abdominal pain. EXAM: CT ABDOMEN AND PELVIS WITH CONTRAST TECHNIQUE: Multidetector CT imaging of the abdomen and pelvis was performed using the standard protocol following bolus administration of intravenous contrast. RADIATION DOSE REDUCTION: This exam was performed according to the departmental dose-optimization program which includes automated exposure control, adjustment of the mA and/or kV according to patient size and/or use of iterative  reconstruction technique. CONTRAST:  187mL OMNIPAQUE IOHEXOL 350 MG/ML SOLN COMPARISON:  None. FINDINGS: Lower chest: Free motion artifact but no pulmonary lesions or pleural effusions. 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 Largest lesion in the left hepatic lobe on image number 18/3 measures 4.4 cm. Index lesion in the right hepatic lobe on image 35/3 measures 4.1 cm. The gallbladder is grossly normal.  No common bile duct dilatation. Pancreas: There is a 2.6 cm lesion in the pancreatic tail which could be the primary neoplasm. There is also a small lesion in the body tail junction region on image 32/3 which measures 10 mm. Spleen: Normal size.  No splenic lesions. Adrenals/Urinary Tract: No adrenal gland lesions. Small renal cysts. No worrisome renal lesions. The bladder is unremarkable. Stomach/Bowel: The stomach, duodenum, small bowel and terminal ileum are grossly normal. No acute inflammatory process, mass lesions or obstructive findings. No obvious colonic lesions to suggest a colonic primary. The appendix is normal. Vascular/Lymphatic: The aorta and branch vessels are patent. Scattered atherosclerotic calcifications. 2.2 cm the celiac axis lymph node on image 30/3. There are scattered borderline retroperitoneal lymph nodes without overt adenopathy. Nodular mesenteric implant on image 55/3 measures 2.1 cm. Mesenteric implant on image 57/3 measures 18 mm. Reproductive: Calcified right-sided fibroid. There is also a E 3.7 cm mass which could be a partially degenerated fibroid. Could not exclude the possibility of endometrial cancer. Pelvic ultrasound or MRI pelvis may be helpful for further evaluation. No adnexal masses. Prominent gonadal veins bilaterally. Other: Small amount of free pelvic fluid is noted. No inguinal adenopathy. Musculoskeletal: No significant bony findings. IMPRESSION: 1. Diffuse hepatic metastatic disease. 2. 2.6 cm lesion in the pancreatic  tail could be the primary neoplasm. However there is a second lesion in the pancreatic body/tail junction region and this could reflect metastatic disease also. 3. Celiac axis lymphadenopathy and mesenteric implants. 4. Borderline retroperitoneal lymph nodes. 5. 3.7 cm mass in the uterus could be a partially degenerated fibroid. Could not exclude the possibility of any medial cancer. PET-CT may be helpful to evaluate all of the above findings and to potentially guide a safe and appropriate biopsy. Electronically Signed   By: Marijo Sanes M.D.   On: 02/11/2021 16:07    Procedures Procedures    Medications Ordered in ED Medications  heparin ADULT infusion 100 units/mL (25000 units/270mL) (900 Units/hr Intravenous New Bag/Given 02/11/21 1708)  sodium chloride flush (NS) 0.9 % injection 3 mL (has no administration in time range)  0.9 %  sodium chloride infusion (has no administration in time range)  acetaminophen (TYLENOL) tablet 650  mg (has no administration in time range)    Or  acetaminophen (TYLENOL) suppository 650 mg (has no administration in time range)  HYDROmorphone (DILAUDID) injection 0.5-1 mg (1 mg Intravenous Given 02/11/21 1819)  ondansetron (ZOFRAN) tablet 4 mg (has no administration in time range)    Or  ondansetron (ZOFRAN) injection 4 mg (has no administration in time range)  polyethylene glycol (MIRALAX / GLYCOLAX) packet 17 g (has no administration in time range)  ALPRAZolam (XANAX) tablet 0.25 mg (0.25 mg Oral Given 02/11/21 1819)  iohexol (OMNIPAQUE) 350 MG/ML injection 100 mL (100 mLs Intravenous Contrast Given 02/11/21 1547)  HYDROmorphone (DILAUDID) injection 1 mg (1 mg Intravenous Given 02/11/21 1642)  ondansetron (ZOFRAN) injection 4 mg (4 mg Intravenous Given 02/11/21 1706)  heparin bolus via infusion 2,000 Units (2,000 Units Intravenous Bolus from Bag 02/11/21 1708)  sodium chloride 0.9 % bolus 1,000 mL (1,000 mLs Intravenous New Bag/Given 02/11/21 1728)    ED Course/  Medical Decision Making/ A&P Clinical Course as of 02/11/21 1851  Wed Feb 11, 2021  1627 Acute PE and concern for metastatic disease.  I have ordered IV heparin. [MT]  1707 The patient and her son at the bedside are updated, as well as her other son [name] by phone, regarding her CT findings.  We will start IV heparin for pulmonary embolism (unclear how long this PE has been present, she is only been on Eliquis for 1 week).  I also explained my concern for metastatic malignancy with her hepatic and pancreatic lesions, which are very likely the cause of her symptoms.  I have a lower suspicion for acute cholecystitis.  She does have a leukocytosis, which may be reactive to the pulmonary embolism, we will check a UA as well.  No evidence of pneumonia or other bacterial source for leukocytosis [MT]  1715 Admitted to the hospitalist [MT]    Clinical Course User Index [MT] Mj Willis, Carola Rhine, MD                           Medical Decision Making Amount and/or Complexity of Data Reviewed Labs: ordered. Radiology: ordered.  Risk Prescription drug management. Decision regarding hospitalization.  This patient presents to the Emergency Department with complaint of abdominal pain. This involves an extensive number of treatment options, and is a complaint that carries with it a high risk of complications and morbidity.  The differential diagnosis includes, but is not limited to, gastritis vs biliary disease vs peptic ulcer vs constipation vs colitis vs UTI vs other  I ordered, reviewed, and interpreted labs, including BMP and CBC.  WBC elevated at 19K.  Patient's UA showed no signs of infection.  Troponin normal I ordered medication  for abdominal pain and/or nausea I ordered imaging studies which included CT chest abd pelvis I independently visualized and interpreted imaging which showed right-sided pulmonary embolism, hepatic and pancreatic lesions with possible lymph node swelling in the abdomen, and the  monitor tracing which showed sinus tachycardia Additional history was obtained from patient's son at bedside as well as the patient's other son by phone  After the interventions stated above, I reevaluated the patient and found that they remained clinically stable; pain had improved, she has intermittent nausea and Zofran was ordered.  I suspect the patient's leukocytosis is likely reactive to the pulmonary embolism.  I do not see any other clear source of bacterial infection based on this work-up.  I have not therefore ordered antibiotics  at this time.   Clinical Course as of 02/11/21 1851  Wed Feb 11, 2021  1627 Acute PE and concern for metastatic disease.  I have ordered IV heparin. [MT]  1707 The patient and her son at the bedside are updated, as well as her other son [name] by phone, regarding her CT findings.  We will start IV heparin for pulmonary embolism (unclear how long this PE has been present, she is only been on Eliquis for 1 week).  I also explained my concern for metastatic malignancy with her hepatic and pancreatic lesions, which are very likely the cause of her symptoms.  I have a lower suspicion for acute cholecystitis.  She does have a leukocytosis, which may be reactive to the pulmonary embolism, we will check a UA as well.  No evidence of pneumonia or other bacterial source for leukocytosis [MT]  1715 Admitted to the hospitalist [MT]    Clinical Course User Index [MT] Wyvonnia Dusky, MD           Final Clinical Impression(s) / ED Diagnoses Final diagnoses:  Liver mass  Other acute pulmonary embolism without acute cor pulmonale ([place])  Pancreatic mass  Abdominal pain, unspecified abdominal location    Rx / DC Orders ED Discharge Orders     None         Wyvonnia Dusky, MD 02/11/21 [phone]

## 2021-02-11 NOTE — ED Provider Triage Note (Signed)
Emergency Medicine Provider Triage Evaluation Note  Aberdeen Hafen , a 76 y.o. female  was evaluated in triage.  Pt complains of Shortness of breath.  She was diagnosed with a DVT in the left leg.  She hasn't missed any doses on her eliquis.   She reports nausea and vomiting.   She reports chest and abdominal pain. She complains of poor appetite.   Review of Systems  Positive: See above Negative:   Physical Exam  BP 108/78    Pulse (!) 110    Temp 98.8 F (37.1 C) (Oral)    SpO2 100%  Gen:   Awake,  Resp:  Normal effort when talking on the phone, when I am in the room she is rapidly speaking with increased labor breathing MSK:   Moves extremities without difficulty  Other:  Patient is very anxious she is constantly moving, speaks over provider consistently and is difficult to redirect to obtain appropriate history.   Medical Decision Making  Medically screening exam initiated at 1:15 PM.  Appropriate orders placed.  Anjalee Cope was informed that the remainder of the evaluation will be completed by another provider, this initial triage assessment does not replace that evaluation, and the importance of remaining in the ED until their evaluation is complete.  Note: Portions of this report may have been transcribed using voice recognition software. Every effort was made to ensure accuracy; however, inadvertent computerized transcription errors may be present    Lorin Glass, PA-C 02/11/21 1323

## 2021-02-11 NOTE — ED Notes (Signed)
Pt transported to CT ?

## 2021-02-11 NOTE — ED Notes (Signed)
Avyn Aden son [phone] requesting an update on the patient

## 2021-02-11 NOTE — Progress Notes (Signed)
ANTICOAGULATION CONSULT NOTE - Initial Consult  Pharmacy Consult for IV heparin Indication: pulmonary embolus  Not on File  Patient Measurements: Height: 5' (152.4 cm) Weight: 54.4 kg (120 lb) IBW/kg (Calculated) : 45.5 Heparin Dosing Weight: 54.4kg  Vital Signs: Temp: 98.8 F (37.1 C) (01/18 1306) Temp Source: Oral (01/18 1306) BP: 130/60 (01/18 1641) Pulse Rate: 106 (01/18 1641)  Labs: Recent Labs    02/11/21 1324  HGB 9.2*  HCT 29.5*  PLT 542*  CREATININE 0.55  TROPONINIHS 8    Estimated Creatinine Clearance: 45 mL/min (by C-G formula based on SCr of 0.55 mg/dL).   Assessment: Angie Rojas presenting with increased shortness of breath to the ED. PMH significant for newly diagnosed DVT in left femoral posterior tibial and peroneal veins on 02/03/2021. Pt was started on Eliquis 5 mg BID, last dose taken at 0900 02/11/21. CT angio positive for acute PE in right lower lobe with possible right ventricular strain. Pharmacy to dose IV heparin.  Hgb 9.2 and decreased, platelets are within normal limits. Renal function appears normal.   Due to possible right heart strain and last dose of Eliquis this morning, will plan for half-bolus of IV heparin. Given recent use of Eliquis, will also need to trend aPTT until heparin level and aPTT correlate.   Goal of Therapy:  Heparin level 0.3-0.7 units/ml aPTT 66-102 seconds Monitor platelets by anticoagulation protocol: Yes   Plan:  IV heparin 2000 unit bolus x1 Start IV heparin 900 units/h 8h heparin level, aPTT Daily heparin level, aPTT, CBC Monitor for signs/symptoms of bleeding Follow-up long-term anticoagulation plan  Thank you for involving pharmacy in this patient's care.  Elita Quick, PharmD PGY1 Ambulatory Care Pharmacy Resident 02/11/2021 5:03 PM  **Pharmacist phone directory can be found on [email or web address] listed under [place]**

## 2021-02-11 NOTE — ED Notes (Signed)
Pt is assisted to bedside commode. When finished Pt is assisted back into bed. Pt has no complaints. Bed locked low safe position call light within reach.

## 2021-02-11 NOTE — ED Triage Notes (Signed)
Pt reports increase shob. Pt dx with DVT in leg last Tuesday and been on Eliquis. Pt also reports RUQ  pain and radiates to back.

## 2021-02-12 ENCOUNTER — Inpatient Hospital Stay: Payer: Medicare Other

## 2021-02-12 ENCOUNTER — Encounter (HOSPITAL_COMMUNITY): Payer: Self-pay | Admitting: Family Medicine

## 2021-02-12 ENCOUNTER — Inpatient Hospital Stay (HOSPITAL_COMMUNITY): Payer: Medicare Other

## 2021-02-12 DIAGNOSIS — I2609 Other pulmonary embolism with acute cor pulmonale: Secondary | ICD-10-CM

## 2021-02-12 DIAGNOSIS — I2699 Other pulmonary embolism without acute cor pulmonale: Secondary | ICD-10-CM | POA: Diagnosis not present

## 2021-02-12 LAB — COMPREHENSIVE METABOLIC PANEL
ALT: 50 U/L — ABNORMAL HIGH (ref 0–44)
AST: 56 U/L — ABNORMAL HIGH (ref 15–41)
Albumin: 2.2 g/dL — ABNORMAL LOW (ref 3.5–5.0)
Alkaline Phosphatase: 334 U/L — ABNORMAL HIGH (ref 38–126)
Anion gap: 7 (ref 5–15)
BUN: 8 mg/dL (ref 8–23)
CO2: 22 mmol/L (ref 22–32)
Calcium: 8 mg/dL — ABNORMAL LOW (ref 8.9–10.3)
Chloride: 103 mmol/L (ref 98–111)
Creatinine, Ser: 0.52 mg/dL (ref 0.44–1.00)
GFR, Estimated: >60 mL/min (ref >60)
Glucose, Bld: 113 mg/dL — ABNORMAL HIGH (ref 70–99)
Potassium: 3.7 mmol/L (ref 3.5–5.1)
Sodium: 132 mmol/L — ABNORMAL LOW (ref 135–145)
Total Bilirubin: 0.6 mg/dL (ref 0.3–1.2)
Total Protein: 5.6 g/dL — ABNORMAL LOW (ref 6.5–8.1)

## 2021-02-12 LAB — CBC
HCT: 27 % — ABNORMAL LOW (ref 36.0–46.0)
Hemoglobin: 8.1 g/dL — ABNORMAL LOW (ref 12.0–15.0)
MCH: 27.8 pg (ref 26.0–34.0)
MCHC: 30 g/dL (ref 30.0–36.0)
MCV: 92.8 fL (ref 80.0–100.0)
Platelets: 460 10*3/uL — ABNORMAL HIGH (ref 150–400)
RBC: 2.91 MIL/uL — ABNORMAL LOW (ref 3.87–5.11)
RDW: 13.5 % (ref 11.5–15.5)
WBC: 18.1 10*3/uL — ABNORMAL HIGH (ref 4.0–10.5)
nRBC: 0 % (ref 0.0–0.2)

## 2021-02-12 LAB — ECHOCARDIOGRAM COMPLETE
AR max vel: 1.67 cm2
AV Area VTI: 1.89 cm2
AV Area mean vel: 1.78 cm2
AV Mean grad: 7 mmHg
AV Peak grad: 13.8 mmHg
Ao pk vel: 1.86 m/s
Area-P 1/2: 2.79 cm2
Height: 60 in
S' Lateral: 2.7 cm
Weight: 1920 oz

## 2021-02-12 LAB — APTT
aPTT: 37 seconds — ABNORMAL HIGH (ref 24–36)
aPTT: 51 seconds — ABNORMAL HIGH (ref 24–36)

## 2021-02-12 LAB — TSH: TSH: 2.716 u[IU]/mL (ref 0.350–4.500)

## 2021-02-12 LAB — HEPARIN LEVEL (UNFRACTIONATED): Heparin Unfractionated: >1.1 IU/mL — ABNORMAL HIGH (ref 0.30–0.70)

## 2021-02-12 MED ORDER — HEPARIN BOLUS VIA INFUSION
1000.0000 [IU] | Freq: Once | INTRAVENOUS | Status: AC
  Administered 2021-02-12: 1000 [IU] via INTRAVENOUS
  Filled 2021-02-12: qty 1000

## 2021-02-12 MED ORDER — HYDROMORPHONE HCL 1 MG/ML IJ SOLN
0.5000 mg | INTRAMUSCULAR | Status: AC | PRN
  Administered 2021-02-13 (×2): 1 mg via INTRAVENOUS
  Filled 2021-02-12 (×2): qty 1

## 2021-02-12 MED ORDER — HEPARIN BOLUS VIA INFUSION
1500.0000 [IU] | Freq: Once | INTRAVENOUS | Status: AC
  Administered 2021-02-12: 1500 [IU] via INTRAVENOUS
  Filled 2021-02-12: qty 1500

## 2021-02-12 NOTE — Progress Notes (Signed)
PROGRESS NOTE    Angie Rojas  ZCH:[id number] DOB: [birth year] DOA: 02/11/2021 PCP: Lajean Manes, MD   Chief Complaint  Patient presents with   Shortness of Breath   Brief Narrative/Hospital Course: Angie Rojas, 76 y.o. female with PMH of anxiety at baseline, recent left femoral vein DVT about a wk PTA and placed on Eliquis by PCP presented to ED with complaint of abdominal pain. Has had abdominal pain 3 to 4 weeks intermittent constant with radiation to the back 10 out of 10 in severity without improvement from pain medication.. She has had intermittent shortness of breath and chest pain  ED Course: Upon arrival to ED, she was tachycardic. cxr negative for any acute pathology.  CT angiogram of chest showed acute PE in segmental and subsegmental branches in the right lower lobe, dilatation of right ventricular cavity suggesting possible right ventricular strain.  No aortic dissection.  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  Borderline retroperitoneal lymph nodes.  Fibroid.  Patient was given IV pain medication and placed on heparin drip and admitted for further work-up.    Subjective: Seen and examined this morning.  Patient is very anxious son is at the bedside.  She is on room air. Complains of belly pain and back pain. Overnight hemodynamically stable blood pressure 100-140s. 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  IR consulted for biopsy.  Continue pain control.  I did discuss with Dr. [name] form hem-onc- no indication for IVC filter and will need to call oncology postbiopsy result.  Awaiting for IR for biopsy.  Right lower lobe PE Recent left  lower leg DVT a wk ago and was started on eliquis: ?  Eliquis failure possibly patient had PE at that time.  Regardless we will continue on heparin drip for now likely will do Lovenox on discharge, will await oncology input after biopsy.  Leukocytosis likely in the setting of #1 no evidence of infection.  Monitor  Anemia suspect from chronic disease monitor hemoglobin Recent Labs  Lab 02/11/21 1324 02/12/21 0326  HGB 9.2* 8.1*  HCT 29.5* 27.0*    Anxiety disorder initiated on Xanax tid prn.  Mild transaminitis likely from liver mets.  Monitor  DVT prophylaxis: Heparin gtt Code Status:   Code Status: Full Code Family Communication: plan of care discussed with patient at bedside. Status is: Inpatient Remains inpatient appropriate because: Remains inpatient for ongoing management of PE and work-up of metastatic disease Disposition: Currently not medically stable for discharge. Anticipated Disposition: TBD  Total time spent in the care of this patient 50 MINUTES Objective: Vitals last 24 hrs: Vitals:   02/12/21 0730 02/12/21 0745 02/12/21 0800 02/12/21 0900  BP:   (!) 115/59 99/66  Pulse: 94 95 90 96  Resp: 14 13 18 19   Temp:      TempSrc:      SpO2: 97% 100% 99% 92%  Weight:      Height:       Weight change:  No intake or output data in the 24 hours ending 02/12/21 1033 Net IO Since Admission: No IO data has been entered for this period [02/12/21 1033]   Physical Examination: General exam: Aa0x3, anxious,older than stated age. HEENT:Oral mucosa moist, Ear/Nose  WNL grossly,dentition normal. Respiratory system: B/l clear BS, no use of accessory muscle, non tender. Cardiovascular system: S1 & S2 +,No JVD. Gastrointestinal system: Abdomen soft, NT,ND, BS+. Nervous System:Alert, awake, moving extremities. Extremities: edema none, distal peripheral pulses palpable.  Skin: No rashes, no icterus. MSK: Normal muscle bulk, tone, power.  Medications reviewed:  Scheduled  Meds:  sodium chloride flush  3 mL Intravenous Q12H   Continuous Infusions:  sodium chloride 100 mL/hr at 02/12/21 0433   heparin 1,100 Units/hr (02/12/21 0400)   Diet Order             Diet NPO time specified Except for: Sips with Meds  Diet effective now                          Weight change:   Wt Readings from Last 3 Encounters:  02/11/21 54.4 kg     Consultants:see note  Procedures:see note Antimicrobials: Anti-infectives (From admission, onward)    None      Culture/Microbiology No results found for: SDES, SPECREQUEST, CULT, REPTSTATUS  Other culture-see note  Unresulted Labs (From admission, onward)     Start     Ordered   02/13/21 0500  Heparin level (unfractionated)  Daily,   R      02/11/21 1705   02/13/21 0500  APTT  Daily,   R      02/11/21 1705   02/12/21 1230  APTT  Once-Timed,   TIMED        02/12/21 0357   02/12/21 0500  CBC  Daily,   R      02/11/21 1705   02/11/21 1730  Cancer antigen 19-9  Once,   R        02/11/21 1729   02/11/21 1730  CEA  Once,   R        02/11/21 1729          Data Reviewed: I have personally reviewed following labs and imaging studies CBC: Recent Labs  Lab 02/11/21 1324 02/12/21 0326  WBC 19.4* 18.1*  NEUTROABS 15.6*  --   HGB 9.2* 8.1*  HCT 29.5* 27.0*  MCV 89.1 92.8  PLT 542* 562*   Basic Metabolic Panel: Recent Labs  Lab 02/11/21 1324 02/11/21 1728 02/12/21 0326  NA 135  --  132*  K 3.9  --  3.7  CL 95*  --  103  CO2 23  --  22  GLUCOSE 116*  --  113*  BUN 11  --  8  CREATININE 0.55  --  0.52  CALCIUM 9.7  --  8.0*  MG  --  2.0  --    GFR: Estimated Creatinine Clearance: 45 mL/min (by C-G formula based on SCr of 0.52 mg/dL). Liver Function Tests: Recent Labs  Lab 02/11/21 1324 02/12/21 0326  AST 61* 56*  ALT 59* 50*  ALKPHOS 413* 334*  BILITOT 0.9 0.6  PROT 6.8 5.6*  ALBUMIN 2.7* 2.2*   Recent Labs  Lab 02/11/21 1324  LIPASE 32   No results for input(s): AMMONIA in  the last 168 hours. Coagulation Profile: No results for input(s): INR, PROTIME in the last 168 hours. Cardiac Enzymes: No results for input(s): CKTOTAL, CKMB, CKMBINDEX, TROPONINI in the last 168 hours. BNP (last 3 results) No results for input(s): PROBNP in the last 8760 hours. HbA1C: No results for input(s): HGBA1C in the last 72 hours. CBG: No results for input(s): GLUCAP in the last 168 hours.  Lipid Profile: No results for input(s): CHOL, HDL, LDLCALC, TRIG, CHOLHDL, LDLDIRECT in the last 72 hours. Thyroid Function Tests: Recent Labs    02/11/21 1728  TSH 2.716   Anemia Panel: No results for input(s): VITAMINB12, FOLATE, FERRITIN, TIBC, IRON, RETICCTPCT in the last 72 hours. Sepsis Labs: No results for input(s): PROCALCITON, LATICACIDVEN in the last 168 hours.  Recent Results (from the past 240 hour(s))  Resp Panel by RT-PCR (Flu A&B, Covid) Nasopharyngeal Swab     Status: None   Collection Time: 02/11/21  5:01 PM   Specimen: Nasopharyngeal Swab; Nasopharyngeal(NP) swabs in vial transport medium  Result Value Ref Range Status   SARS Coronavirus 2 by RT PCR NEGATIVE NEGATIVE Final    Comment: (NOTE) SARS-CoV-2 target nucleic acids are NOT DETECTED.  The SARS-CoV-2 RNA is generally detectable in upper respiratory specimens during the acute phase of infection. The lowest concentration of SARS-CoV-2 viral copies this assay can detect is 138 copies/mL. A negative result does not preclude SARS-Cov-2 infection and should not be used as the sole basis for treatment or other patient management decisions. A negative result may occur with  improper specimen collection/handling, submission of specimen other than nasopharyngeal swab, presence of viral mutation(s) within the areas targeted by this assay, and inadequate number of viral copies(<138 copies/mL). A negative result must be combined with clinical observations, patient history, and epidemiological information. The expected  result is Negative.  Fact Sheet for Patients:  EntrepreneurPulse.com.au  Fact Sheet for Healthcare Providers:  IncredibleEmployment.be  This test is no t yet approved or cleared by the [name] FDA and  has been authorized for detection and/or diagnosis of SARS-CoV-2 by FDA under an Emergency Use Authorization (EUA). This EUA will remain  in effect (meaning this test can be used) for the duration of the COVID-19 declaration under Section 564(b)(1) of the Act, 21 U.S.C.section 360bbb-3(b)(1), unless the authorization is terminated  or revoked sooner.       Influenza A by PCR NEGATIVE NEGATIVE Final   Influenza B by PCR NEGATIVE NEGATIVE Final    Comment: (NOTE) The Xpert Xpress SARS-CoV-2/FLU/RSV plus assay is intended as an aid in the diagnosis of influenza from Nasopharyngeal swab specimens and should not be used as a sole basis for treatment. Nasal washings and aspirates are unacceptable for Xpert Xpress SARS-CoV-2/FLU/RSV testing.  Fact Sheet for Patients: EntrepreneurPulse.com.au  Fact Sheet for Healthcare Providers: IncredibleEmployment.be  This test is not yet approved or cleared by the [name] FDA and has been authorized for detection and/or diagnosis of SARS-CoV-2 by FDA under an Emergency Use Authorization (EUA). This EUA will remain in effect (meaning this test can be used) for the duration of the COVID-19 declaration under Section 564(b)(1) of the Act, 21 U.S.C. section 360bbb-3(b)(1), unless the authorization is terminated or revoked.  Performed at [facility], [place] [address] [place], [place] [address]      Radiology Studies: DG Chest 2 View  Result Date: 02/11/2021 CLINICAL DATA:  Shortness of breath and vomiting.  DVT. EXAM: CHEST - 2 VIEW COMPARISON:  None. FINDINGS: Trachea is midline. Heart size normal. Lungs are clear. No pleural fluid. IMPRESSION: No acute  findings. Electronically Signed   By: Lorin Picket M.D.   On: 02/11/2021 13:50   CT Head Wo Contrast  Result Date: 02/11/2021 CLINICAL DATA:  Mental status change.  Rule out metastatic disease EXAM: CT HEAD WITHOUT CONTRAST TECHNIQUE: Contiguous axial images were obtained from the base of the skull through the  vertex without intravenous contrast. RADIATION DOSE REDUCTION: This exam was performed according to the departmental dose-optimization program which includes automated exposure control, adjustment of the mA and/or kV according to patient size and/or use of iterative reconstruction technique. COMPARISON:  MRI head 07/03/2013 FINDINGS: Brain: No evidence of acute infarction, hemorrhage, hydrocephalus, extra-axial collection or mass lesion/mass effect. Vascular: Negative for hyperdense vessel Skull: Negative Sinuses/Orbits: Paranasal sinuses clear.  Negative orbit Other: None IMPRESSION: Negative CT head Electronically Signed   By: Franchot Gallo M.D.   On: 02/11/2021 18:07   CT Angio Chest PE W and/or Wo Contrast  Result Date: 02/11/2021 CLINICAL DATA:  Shortness of breath, history of DVT in the left lower extremity EXAM: CT ANGIOGRAPHY CHEST WITH CONTRAST TECHNIQUE: Multidetector CT imaging of the chest was performed using the standard protocol during bolus administration of intravenous contrast. Multiplanar CT image reconstructions and MIPs were obtained to evaluate the vascular anatomy. RADIATION DOSE REDUCTION: This exam was performed according to the departmental dose-optimization program which includes automated exposure control, adjustment of the mA and/or kV according to patient size and/or use of iterative reconstruction technique. CONTRAST:  126mL OMNIPAQUE IOHEXOL 350 MG/ML SOLN COMPARISON:  None. FINDINGS: Cardiovascular: Contrast density in the pulmonary artery branches is less than optimal. There is more contrast enhancement in the systemic vessels suggesting less than optimal timing of  the imaging sequence. As far as seen, there are no filling defects in central pulmonary artery branches. There are few intraluminal filling defects in the segmental and subsegmental branches in the right lower lung fields with small thrombus burden. There is no ectasia of main pulmonary artery. Right ventricular cavity is more prominent than usual suggesting possible right ventricular strain. Midportion of right ventricular cavity measures 3.7 cm in diameter. Mediastinum/Nodes: No significant lymphadenopathy seen. Lungs/Pleura: There is no focal pulmonary consolidation. There is no pleural effusion or pneumothorax. Small linear densities in the lower lung fields suggest scarring or subsegmental atelectasis. Upper Abdomen: There are numerous space-occupying lesions of varying sizes in the visualized upper portions of liver measuring up to 5.3 cm in diameter. There is fatty infiltration in the liver. Musculoskeletal: Unremarkable. Review of the MIP images confirms the above findings. IMPRESSION: Acute pulmonary embolism in few segmental and subsegmental branches in the right lower lobe. Small thrombus burden. There is dilation of right ventricular cavity suggesting possible right ventricular strain. There is no evidence of thoracic aortic dissection. There is no focal pulmonary consolidation. There are numerous space-occupying lesions of varying sizes and varying attenuation in the liver suggesting extensive hepatic metastatic disease. Fatty infiltration is seen in the liver. Electronically Signed   By: Elmer Picker M.D.   On: 02/11/2021 16:02   CT ABDOMEN PELVIS W CONTRAST  Result Date: 02/11/2021 CLINICAL DATA:  Abdominal pain. EXAM: CT ABDOMEN AND PELVIS WITH CONTRAST TECHNIQUE: Multidetector CT imaging of the abdomen and pelvis was performed using the standard protocol following bolus administration of intravenous contrast. RADIATION DOSE REDUCTION: This exam was performed according to the departmental  dose-optimization program which includes automated exposure control, adjustment of the mA and/or kV according to patient size and/or use of iterative reconstruction technique. CONTRAST:  118mL OMNIPAQUE IOHEXOL 350 MG/ML SOLN COMPARISON:  None. FINDINGS: Lower chest: Free motion artifact but no pulmonary lesions or pleural effusions. 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 Largest lesion in the left hepatic lobe on image number 18/3 measures 4.4 cm. Index lesion in the right hepatic lobe on image 35/3 measures 4.1  cm. The gallbladder is grossly normal.  No common bile duct dilatation. Pancreas: There is a 2.6 cm lesion in the pancreatic tail which could be the primary neoplasm. There is also a small lesion in the body tail junction region on image 32/3 which measures 10 mm. Spleen: Normal size.  No splenic lesions. Adrenals/Urinary Tract: No adrenal gland lesions. Small renal cysts. No worrisome renal lesions. The bladder is unremarkable. Stomach/Bowel: The stomach, duodenum, small bowel and terminal ileum are grossly normal. No acute inflammatory process, mass lesions or obstructive findings. No obvious colonic lesions to suggest a colonic primary. The appendix is normal. Vascular/Lymphatic: The aorta and branch vessels are patent. Scattered atherosclerotic calcifications. 2.2 cm the celiac axis lymph node on image 30/3. There are scattered borderline retroperitoneal lymph nodes without overt adenopathy. Nodular mesenteric implant on image 55/3 measures 2.1 cm. Mesenteric implant on image 57/3 measures 18 mm. Reproductive: Calcified right-sided fibroid. There is also a E 3.7 cm mass which could be a partially degenerated fibroid. Could not exclude the possibility of endometrial cancer. Pelvic ultrasound or MRI pelvis may be helpful for further evaluation. No adnexal masses. Prominent gonadal veins bilaterally. Other: Small amount of free pelvic fluid is noted. No  inguinal adenopathy. Musculoskeletal: No significant bony findings. IMPRESSION: 1. Diffuse hepatic metastatic disease. 2. 2.6 cm lesion in the pancreatic tail could be the primary neoplasm. However there is a second lesion in the pancreatic body/tail junction region and this could reflect metastatic disease also. 3. Celiac axis lymphadenopathy and mesenteric implants. 4. Borderline retroperitoneal lymph nodes. 5. 3.7 cm mass in the uterus could be a partially degenerated fibroid. Could not exclude the possibility of any medial cancer. PET-CT may be helpful to evaluate all of the above findings and to potentially guide a safe and appropriate biopsy. Electronically Signed   By: Marijo Sanes M.D.   On: 02/11/2021 16:07     LOS: 1 day   Antonieta Pert, MD [facility]  02/12/2021, 10:33 AM

## 2021-02-12 NOTE — ED Notes (Signed)
Heparin rate change and bolus verified by Wynonia Sours RN. No signs of bleeding. Pt updated regarding POC.

## 2021-02-12 NOTE — Consult Note (Addendum)
[place]  [phone] [phone]   MEDICAL ONCOLOGY - INITIAL CONSULTATION  Referral MD: Dr. Antonieta Pert  Reason for Referral: Liver lesions, pancreatic tail mass  HPI: Ms. Angie Rojas is a 76 year old female with a past medical history significant for a left lower extremity DVT diagnosed in January 2023 and recently started on Eliquis.  She presented to the emergency department with abdominal pain.  She has been having abdominal pain for 3 to 4 weeks.  Pain has been intermittent but becoming more constant and radiates to her back.  She rated pain 10/10.  She was also experiencing shortness of breath and chest pain.  Initial lab work showed a WBC of 19.4, hemoglobin 9.2, platelet count 542,000, albumin 2.7, AST 61, ALT 59, alk phos 413.  CTA chest was performed which showed acute PE in few segmental and subsegmental branches in the right lower lobe with small thrombus burden.  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 CEA was mildly elevated at 70.0 and CA19.9 was elevated at 84,088.  She underwent ultrasound-guided liver biopsy on 02/12/2021.  Results are currently pending.   I met with the patient and her 2 sons at the bedside.  She had just returned from her liver biopsy.  She tolerated the procedure well.  She is reporting increased back pain and requesting pain medication.  States IV pain medication was working better, but apparently that was discontinued earlier today.  She has been experiencing some reflux type symptoms for many months.  She is treating symptomatically.  She has been having  abdominal pain for several weeks which has worsened and radiates to her back.  She has had some intermittent nausea and vomiting.  Reports decrease in appetite and weight loss.  Does not routinely weigh herself so unsure how much weight she has lost. The patient is widowed -husband died about a year ago.  She has 2 sons.  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  Medical oncology was asked to see the patient make recommendations regarding her abnormal CT scan findings.  Past Medical History:  Diagnosis Date   DVT (deep venous thrombosis) (HCC)    LLE DVT Jan 2023  :  History reviewed. No pertinent surgical history.:   Current Facility-Administered Medications  Medication Dose Route Frequency Provider Last Rate Last Admin   0.9 %  sodium chloride infusion   Intravenous Continuous Darliss Cheney, MD 100 mL/hr at 02/12/21 0433 Rate Verify at 02/12/21 0433   acetaminophen (TYLENOL) tablet 650 mg  650 mg Oral Q6H PRN Darliss Cheney, MD       Or   acetaminophen (TYLENOL) suppository 650 mg  650 mg Rectal Q6H PRN Darliss Cheney, MD       ALPRAZolam Duanne Moron) tablet 0.25 mg  0.25 mg Oral TID PRN Darliss Cheney, MD   0.25 mg at 02/11/21 1819   heparin ADULT infusion 100 units/mL (25000 units/255mL)  1,100 Units/hr Intravenous Continuous Lavenia Atlas, RPH 11 mL/hr at 02/12/21 0400 1,100 Units/hr at 02/12/21 0400   HYDROmorphone (DILAUDID) injection 0.5-1 mg  0.5-1 mg Intravenous Q3H PRN Pahwani,  Einar Grad, MD   1 mg at 02/12/21 1526   ondansetron (ZOFRAN) tablet 4 mg  4 mg Oral Q6H PRN Darliss Cheney, MD       Or   ondansetron (ZOFRAN) injection 4 mg  4 mg Intravenous Q6H PRN Darliss Cheney, MD   4 mg at 02/12/21 1526   polyethylene glycol (MIRALAX / GLYCOLAX) packet 17 g  17 g Oral Daily PRN Darliss Cheney, MD       sodium chloride flush (NS) 0.9 % injection 3 mL  3 mL Intravenous Q12H Darliss Cheney, MD       Current Outpatient Medications   Medication Sig Dispense Refill   ELIQUIS 5 MG TABS tablet Take 5 mg by mouth 2 (two) times daily.     Multiple Vitamin (MULTIVITAMIN WITH MINERALS) TABS tablet Take 1 tablet by mouth daily.     sodium chloride (OCEAN) 0.65 % SOLN nasal spray Place 1 spray into both nostrils 3 (three) times daily.     traZODone (DESYREL) 50 MG tablet Take 25 mg by mouth at bedtime.       No Known Allergies:  History reviewed. No pertinent family history.:   Social History   Socioeconomic History   Marital status: Married    Spouse name: Not on file   Number of children: Not on file   Years of education: Not on file   Highest education level: Not on file  Occupational History   Not on file  Tobacco Use   Smoking status: Never   Smokeless tobacco: Never  Substance and Sexual Activity   Alcohol use: Never   Drug use: Not on file   Sexual activity: Not on file  Other Topics Concern   Not on file  Social History Narrative   Not on file   Social Determinants of Health   Financial Resource Strain: Not on file  Food Insecurity: Not on file  Transportation Needs: Not on file  Physical Activity: Not on file  Stress: Not on file  Social Connections: Not on file  Intimate Partner Violence: Not on file  :  Review of Systems: A comprehensive 14 point review of systems was negative except as noted in the HPI.  Exam: Patient Vitals for the past 24 hrs:  BP Pulse Resp SpO2 Height Weight  02/12/21 1200 (!) 117/48 94 18 100 % -- --  02/12/21 0900 99/66 96 19 92 % -- --  02/12/21 0800 (!) 115/59 90 18 99 % -- --  02/12/21 0745 -- 95 13 100 % -- --  02/12/21 0730 -- 94 14 97 % -- --  02/12/21 0715 -- 91 13 94 % -- --  02/12/21 0700 (!) 103/59 97 20 97 % -- --  02/12/21 0645 (!) 106/56 100 16 100 % -- --  02/12/21 0615 -- 94 20 97 % -- --  02/12/21 0600 104/60 93 12 92 % -- --  02/12/21 0500 (!) 146/90 89 19 100 % -- --  02/12/21 0430 -- 94 10 100 % -- --  02/12/21 0400 (!) 103/56 98 19 97 %  -- --  02/12/21 0350 -- 94 11 98 % -- --  02/12/21 0336 -- -- -- (!) 88 % -- --  02/12/21 0310 -- 94 14 93 % -- --  02/12/21 0300 (!) 109/58 99 17 93 % -- --  02/12/21 0245 -- -- -- 97 % -- --  02/12/21 0200 (!) 101/50 96 17 96 % -- --  02/12/21 0100 (!) 107/49 99  14 95 % -- --  02/12/21 0015 112/64 96 16 97 % -- --  02/11/21 2342 -- 98 17 94 % -- --  02/11/21 2230 -- 100 15 91 % -- --  02/11/21 2100 (!) 104/51 (!) 104 (!) 22 99 % -- --  02/11/21 2006 (!) 111/55 (!) 107 17 99 % -- --  02/11/21 1930 117/70 (!) 106 12 93 % -- --  02/11/21 1915 -- (!) 109 16 98 % -- --  02/11/21 1900 (!) 131/54 (!) 112 19 95 % -- --  02/11/21 1730 123/75 (!) 104 20 100 % -- --  02/11/21 1700 131/71 (!) 112 (!) 23 99 % -- --  02/11/21 1641 130/60 (!) 106 20 100 % -- --  02/11/21 1641 130/60 (!) 110 18 100 % -- --  02/11/21 1603 -- -- -- -- 5' (1.524 m) 54.4 kg  02/11/21 1536 (!) 153/74 (!) 108 18 100 % -- --    General: Awake and alert, distressed over our conversation today Eyes:  no scleral icterus.   ENT:  There were no oropharyngeal lesions.    Lymphatics:  Negative cervical, supraclavicular or axillary adenopathy.   Respiratory: lungs were clear bilaterally without wheezing or crackles.   Cardiovascular:  Regular rate and rhythm, S1/S2, without murmur, rub or gallop.  Trace edema to the left lower extremity, none on the right. GI: Positive bowel sounds, soft, tenderness over the epigastric area Skin exam was without echymosis, petichae.   Neuro exam was nonfocal. Patient was alert and oriented.  Very upset regarding discussion over scan results and potential diagnosis.   Lab Results  Component Value Date   WBC 18.1 (H) 02/12/2021   HGB 8.1 (L) 02/12/2021   HCT 27.0 (L) 02/12/2021   PLT 460 (H) 02/12/2021   GLUCOSE 113 (H) 02/12/2021   ALT 50 (H) 02/12/2021   AST 56 (H) 02/12/2021   NA 132 (L) 02/12/2021   K 3.7 02/12/2021   CL 103 02/12/2021   CREATININE 0.52 02/12/2021   BUN 8  02/12/2021   CO2 22 02/12/2021    DG Chest 2 View  Result Date: 02/11/2021 CLINICAL DATA:  Shortness of breath and vomiting.  DVT. EXAM: CHEST - 2 VIEW COMPARISON:  None. FINDINGS: Trachea is midline. Heart size normal. Lungs are clear. No pleural fluid. IMPRESSION: No acute findings. Electronically Signed   By: Lorin Picket M.D.   On: 02/11/2021 13:50   CT Head Wo Contrast  Result Date: 02/11/2021 CLINICAL DATA:  Mental status change.  Rule out metastatic disease EXAM: CT HEAD WITHOUT CONTRAST TECHNIQUE: Contiguous axial images were obtained from the base of the skull through the vertex without intravenous contrast. RADIATION DOSE REDUCTION: This exam was performed according to the departmental dose-optimization program which includes automated exposure control, adjustment of the mA and/or kV according to patient size and/or use of iterative reconstruction technique. COMPARISON:  MRI head 07/03/2013 FINDINGS: Brain: No evidence of acute infarction, hemorrhage, hydrocephalus, extra-axial collection or mass lesion/mass effect. Vascular: Negative for hyperdense vessel Skull: Negative Sinuses/Orbits: Paranasal sinuses clear.  Negative orbit Other: None IMPRESSION: Negative CT head Electronically Signed   By: Franchot Gallo M.D.   On: 02/11/2021 18:07   CT Angio Chest PE W and/or Wo Contrast  Result Date: 02/11/2021 CLINICAL DATA:  Shortness of breath, history of DVT in the left lower extremity EXAM: CT ANGIOGRAPHY CHEST WITH CONTRAST TECHNIQUE: Multidetector CT imaging of the chest was performed using the standard protocol during bolus administration of  intravenous contrast. Multiplanar CT image reconstructions and MIPs were obtained to evaluate the vascular anatomy. RADIATION DOSE REDUCTION: This exam was performed according to the departmental dose-optimization program which includes automated exposure control, adjustment of the mA and/or kV according to patient size and/or use of iterative  reconstruction technique. CONTRAST:  179mL OMNIPAQUE IOHEXOL 350 MG/ML SOLN COMPARISON:  None. FINDINGS: Cardiovascular: Contrast density in the pulmonary artery branches is less than optimal. There is more contrast enhancement in the systemic vessels suggesting less than optimal timing of the imaging sequence. As far as seen, there are no filling defects in central pulmonary artery branches. There are few intraluminal filling defects in the segmental and subsegmental branches in the right lower lung fields with small thrombus burden. There is no ectasia of main pulmonary artery. Right ventricular cavity is more prominent than usual suggesting possible right ventricular strain. Midportion of right ventricular cavity measures 3.7 cm in diameter. Mediastinum/Nodes: No significant lymphadenopathy seen. Lungs/Pleura: There is no focal pulmonary consolidation. There is no pleural effusion or pneumothorax. Small linear densities in the lower lung fields suggest scarring or subsegmental atelectasis. Upper Abdomen: There are numerous space-occupying lesions of varying sizes in the visualized upper portions of liver measuring up to 5.3 cm in diameter. There is fatty infiltration in the liver. Musculoskeletal: Unremarkable. Review of the MIP images confirms the above findings. IMPRESSION: Acute pulmonary embolism in few segmental and subsegmental branches in the right lower lobe. Small thrombus burden. There is dilation of right ventricular cavity suggesting possible right ventricular strain. There is no evidence of thoracic aortic dissection. There is no focal pulmonary consolidation. There are numerous space-occupying lesions of varying sizes and varying attenuation in the liver suggesting extensive hepatic metastatic disease. Fatty infiltration is seen in the liver. Electronically Signed   By: Elmer Picker M.D.   On: 02/11/2021 16:02   CT ABDOMEN PELVIS W CONTRAST  Result Date: 02/11/2021 CLINICAL DATA:   Abdominal pain. EXAM: CT ABDOMEN AND PELVIS WITH CONTRAST TECHNIQUE: Multidetector CT imaging of the abdomen and pelvis was performed using the standard protocol following bolus administration of intravenous contrast. RADIATION DOSE REDUCTION: This exam was performed according to the departmental dose-optimization program which includes automated exposure control, adjustment of the mA and/or kV according to patient size and/or use of iterative reconstruction technique. CONTRAST:  129mL OMNIPAQUE IOHEXOL 350 MG/ML SOLN COMPARISON:  None. FINDINGS: Lower chest: Free motion artifact but no pulmonary lesions or pleural effusions. 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 Largest lesion in the left hepatic lobe on image number 18/3 measures 4.4 cm. Index lesion in the right hepatic lobe on image 35/3 measures 4.1 cm. The gallbladder is grossly normal.  No common bile duct dilatation. Pancreas: There is a 2.6 cm lesion in the pancreatic tail which could be the primary neoplasm. There is also a small lesion in the body tail junction region on image 32/3 which measures 10 mm. Spleen: Normal size.  No splenic lesions. Adrenals/Urinary Tract: No adrenal gland lesions. Small renal cysts. No worrisome renal lesions. The bladder is unremarkable. Stomach/Bowel: The stomach, duodenum, small bowel and terminal ileum are grossly normal. No acute inflammatory process, mass lesions or obstructive findings. No obvious colonic lesions to suggest a colonic primary. The appendix is normal. Vascular/Lymphatic: The aorta and branch vessels are patent. Scattered atherosclerotic calcifications. 2.2 cm the celiac axis lymph node on image 30/3. There are scattered borderline retroperitoneal lymph nodes without overt adenopathy. Nodular mesenteric implant on image 55/3  measures 2.1 cm. Mesenteric implant on image 57/3 measures 18 mm. Reproductive: Calcified right-sided fibroid. There is also a E 3.7 cm  mass which could be a partially degenerated fibroid. Could not exclude the possibility of endometrial cancer. Pelvic ultrasound or MRI pelvis may be helpful for further evaluation. No adnexal masses. Prominent gonadal veins bilaterally. Other: Small amount of free pelvic fluid is noted. No inguinal adenopathy. Musculoskeletal: No significant bony findings. IMPRESSION: 1. Diffuse hepatic metastatic disease. 2. 2.6 cm lesion in the pancreatic tail could be the primary neoplasm. However there is a second lesion in the pancreatic body/tail junction region and this could reflect metastatic disease also. 3. Celiac axis lymphadenopathy and mesenteric implants. 4. Borderline retroperitoneal lymph nodes. 5. 3.7 cm mass in the uterus could be a partially degenerated fibroid. Could not exclude the possibility of any medial cancer. PET-CT may be helpful to evaluate all of the above findings and to potentially guide a safe and appropriate biopsy. Electronically Signed   By: Marijo Sanes M.D.   On: 02/11/2021 16:07   US Venous Img Lower Unilateral Left (DVT)  Result Date: 02/03/2021 CLINICAL DATA:  Left lower extremity pain. EXAM: Left LOWER EXTREMITY VENOUS DOPPLER ULTRASOUND TECHNIQUE: Gray-scale sonography with graded compression, as well as color Doppler and duplex ultrasound were performed to evaluate the lower extremity deep venous systems from the level of the common femoral vein and including the common femoral, femoral, profunda femoral, popliteal and calf veins including the posterior tibial, peroneal and gastrocnemius veins when visible. The superficial great saphenous vein was also interrogated. Spectral Doppler was utilized to evaluate flow at rest and with distal augmentation maneuvers in the common femoral, femoral and popliteal veins. COMPARISON:  January 15, 2021. FINDINGS: Contralateral Common Femoral Vein: Respiratory phasicity is normal and symmetric with the symptomatic side. No evidence of thrombus.  Normal compressibility. Common Femoral Vein: No evidence of thrombus. Normal compressibility, respiratory phasicity and response to augmentation. Saphenofemoral Junction: No evidence of thrombus. Normal compressibility and flow on color Doppler imaging. Profunda Femoral Vein: No evidence of thrombus. Normal compressibility and flow on color Doppler imaging. Femoral Vein: Noncompressible with no flow consistent with occlusive thrombus. Popliteal Vein: No evidence of thrombus. Normal compressibility, respiratory phasicity and response to augmentation. Calf Veins: Posterior tibial and peroneal veins are noncompressible consistent with occlusive thrombus. Superficial Great Saphenous Vein: No evidence of thrombus. Normal compressibility. Venous Reflux:  None. Other Findings:  None. IMPRESSION: Occlusive deep venous thrombosis is noted in left femoral, posterior tibial and peroneal veins. Electronically Signed   By: Marijo Conception M.D.   On: 02/03/2021 14:08   US Venous Img Lower Unilateral Left (DVT)  Result Date: 01/15/2021 CLINICAL DATA:  Left posterior thigh phlebitis. EXAM: LEFT LOWER EXTREMITY VENOUS DOPPLER ULTRASOUND TECHNIQUE: Gray-scale sonography with compression, as well as color and duplex ultrasound, were performed to evaluate the deep venous system(s) from the level of the common femoral vein through the popliteal and proximal calf veins. COMPARISON:  None. FINDINGS: VENOUS Normal compressibility of the common femoral, superficial femoral, and popliteal veins, as well as the visualized calf veins. Visualized portions of profunda femoral vein and great saphenous vein unremarkable. No filling defects to suggest DVT on grayscale or color Doppler imaging. Doppler waveforms show normal direction of venous flow, normal respiratory plasticity and response to augmentation. Limited views of the contralateral common femoral vein are unremarkable. Superficial vein thrombosis of the lesser saphenous vein in the  thigh. OTHER Soft tissue edema of the left posterior  thigh without evidence of drainable fluid collection. Limitations: none IMPRESSION: 1. No evidence of deep vein thrombosis. 2. Superficial vein thrombophlebitis of the small saphenous vein in the thigh. Electronically Signed   By: Keane Police D.O.   On: 01/15/2021 16:37   ECHOCARDIOGRAM COMPLETE  Result Date: 02/12/2021    ECHOCARDIOGRAM REPORT   Patient Name:   MINNA DUMIRE Date of Exam: 02/12/2021 Medical Rec #:  [id number]         Height:       60.0 in Accession #:    [id number]        Weight:       120.0 lb Date of Birth:  25-May-1945         BSA:          1.502 m Patient Age:    87 years          BP:           103/59 mmHg Patient Gender: F                 HR:           97 bpm. Exam Location:  Inpatient Procedure: 2D Echo Indications:    Pulmonary embolism  History:        Patient has no prior history of Echocardiogram examinations.  Sonographer:    Arlyss Gandy Referring Phys: [id number] RAVI PAHWANI IMPRESSIONS  1. Left ventricular ejection fraction, by estimation, is 60 to 65%. The left ventricle has normal function. The left ventricle has no regional wall motion abnormalities. Left ventricular diastolic parameters were normal.  2. Right ventricular systolic function is normal. The right ventricular size is normal. There is normal pulmonary artery systolic pressure.  3. The mitral valve is normal in structure. Trivial mitral valve regurgitation. No evidence of mitral stenosis.  4. The aortic valve is tricuspid. Aortic valve regurgitation is not visualized. No aortic stenosis is present.  5. The inferior vena cava is normal in size with greater than 50% respiratory variability, suggesting right atrial pressure of 3 mmHg. Comparison(s): No prior Echocardiogram. FINDINGS  Left Ventricle: Left ventricular ejection fraction, by estimation, is 60 to 65%. The left ventricle has normal function. The left ventricle has no regional wall motion abnormalities. The  left ventricular internal cavity size was normal in size. There is  no left ventricular hypertrophy. Left ventricular diastolic parameters were normal. Right Ventricle: The right ventricular size is normal. Right ventricular systolic function is normal. There is normal pulmonary artery systolic pressure. The tricuspid regurgitant velocity is 2.51 m/s, and with an assumed right atrial pressure of 3 mmHg,  the estimated right ventricular systolic pressure is 23.5 mmHg. Left Atrium: Left atrial size was normal in size. Right Atrium: Right atrial size was normal in size. Pericardium: There is no evidence of pericardial effusion. Mitral Valve: The mitral valve is normal in structure. Trivial mitral valve regurgitation. No evidence of mitral valve stenosis. Tricuspid Valve: The tricuspid valve is normal in structure. Tricuspid valve regurgitation is mild . No evidence of tricuspid stenosis. Aortic Valve: The aortic valve is tricuspid. Aortic valve regurgitation is not visualized. No aortic stenosis is present. Aortic valve mean gradient measures 7.0 mmHg. Aortic valve peak gradient measures 13.8 mmHg. Pulmonic Valve: The pulmonic valve was normal in structure. Pulmonic valve regurgitation is not visualized. No evidence of pulmonic stenosis. Aorta: The aortic root is normal in size and structure. Venous: The inferior vena cava is normal in size with greater than 50%  respiratory variability, suggesting right atrial pressure of 3 mmHg. IAS/Shunts: No atrial level shunt detected by color flow Doppler.  LEFT VENTRICLE PLAX 2D LVIDd:         3.80 cm   Diastology LVIDs:         2.70 cm   LV e' medial:    11.00 cm/s LV PW:         0.80 cm   LV E/e' medial:  9.6 LV IVS:        0.80 cm   LV e' lateral:   11.00 cm/s LVOT diam:     1.80 cm   LV E/e' lateral: 9.6 LV SV:         69 LV SV Index:   46 LVOT Area:     2.54 cm  RIGHT VENTRICLE             IVC RV Basal diam:  3.20 cm     IVC diam: 1.50 cm RV Mid diam:    2.50 cm RV S prime:      15.40 cm/s TAPSE (M-mode): 2.1 cm LEFT ATRIUM             Index        RIGHT ATRIUM           Index LA diam:        2.90 cm 1.93 cm/m   RA Area:     11.40 cm LA Vol (A2C):   30.4 ml 20.24 ml/m  RA Volume:   23.70 ml  15.78 ml/m LA Vol (A4C):   50.8 ml 33.81 ml/m LA Biplane Vol: 40.8 ml 27.16 ml/m  AORTIC VALVE AV Area (Vmax):    1.67 cm AV Area (Vmean):   1.78 cm AV Area (VTI):     1.89 cm AV Vmax:           186.00 cm/s AV Vmean:          120.000 cm/s AV VTI:            0.363 m AV Peak Grad:      13.8 mmHg AV Mean Grad:      7.0 mmHg LVOT Vmax:         122.00 cm/s LVOT Vmean:        83.900 cm/s LVOT VTI:          0.270 m LVOT/AV VTI ratio: 0.74  AORTA Ao Root diam: 2.60 cm Ao Asc diam:  2.60 cm MITRAL VALVE                TRICUSPID VALVE MV Area (PHT): 2.79 cm     TR Peak grad:   25.2 mmHg MV Decel Time: 272 msec     TR Vmax:        251.00 cm/s MV E velocity: 106.00 cm/s MV A velocity: 94.70 cm/s   SHUNTS MV E/A ratio:  1.12         Systemic VTI:  0.27 m                             Systemic Diam: 1.80 cm Kirk Ruths MD Electronically signed by Kirk Ruths MD Signature Date/Time: 02/12/2021/11:40:05 AM    Final      DG Chest 2 View  Result Date: 02/11/2021 CLINICAL DATA:  Shortness of breath and vomiting.  DVT. EXAM: CHEST - 2 VIEW COMPARISON:  None. FINDINGS: Trachea is midline. Heart size normal. Lungs are clear.  No pleural fluid. IMPRESSION: No acute findings. Electronically Signed   By: Lorin Picket M.D.   On: 02/11/2021 13:50   CT Head Wo Contrast  Result Date: 02/11/2021 CLINICAL DATA:  Mental status change.  Rule out metastatic disease EXAM: CT HEAD WITHOUT CONTRAST TECHNIQUE: Contiguous axial images were obtained from the base of the skull through the vertex without intravenous contrast. RADIATION DOSE REDUCTION: This exam was performed according to the departmental dose-optimization program which includes automated exposure control, adjustment of the mA and/or kV according to  patient size and/or use of iterative reconstruction technique. COMPARISON:  MRI head 07/03/2013 FINDINGS: Brain: No evidence of acute infarction, hemorrhage, hydrocephalus, extra-axial collection or mass lesion/mass effect. Vascular: Negative for hyperdense vessel Skull: Negative Sinuses/Orbits: Paranasal sinuses clear.  Negative orbit Other: None IMPRESSION: Negative CT head Electronically Signed   By: Franchot Gallo M.D.   On: 02/11/2021 18:07   CT Angio Chest PE W and/or Wo Contrast  Result Date: 02/11/2021 CLINICAL DATA:  Shortness of breath, history of DVT in the left lower extremity EXAM: CT ANGIOGRAPHY CHEST WITH CONTRAST TECHNIQUE: Multidetector CT imaging of the chest was performed using the standard protocol during bolus administration of intravenous contrast. Multiplanar CT image reconstructions and MIPs were obtained to evaluate the vascular anatomy. RADIATION DOSE REDUCTION: This exam was performed according to the departmental dose-optimization program which includes automated exposure control, adjustment of the mA and/or kV according to patient size and/or use of iterative reconstruction technique. CONTRAST:  136mL OMNIPAQUE IOHEXOL 350 MG/ML SOLN COMPARISON:  None. FINDINGS: Cardiovascular: Contrast density in the pulmonary artery branches is less than optimal. There is more contrast enhancement in the systemic vessels suggesting less than optimal timing of the imaging sequence. As far as seen, there are no filling defects in central pulmonary artery branches. There are few intraluminal filling defects in the segmental and subsegmental branches in the right lower lung fields with small thrombus burden. There is no ectasia of main pulmonary artery. Right ventricular cavity is more prominent than usual suggesting possible right ventricular strain. Midportion of right ventricular cavity measures 3.7 cm in diameter. Mediastinum/Nodes: No significant lymphadenopathy seen. Lungs/Pleura: There is no  focal pulmonary consolidation. There is no pleural effusion or pneumothorax. Small linear densities in the lower lung fields suggest scarring or subsegmental atelectasis. Upper Abdomen: There are numerous space-occupying lesions of varying sizes in the visualized upper portions of liver measuring up to 5.3 cm in diameter. There is fatty infiltration in the liver. Musculoskeletal: Unremarkable. Review of the MIP images confirms the above findings. IMPRESSION: Acute pulmonary embolism in few segmental and subsegmental branches in the right lower lobe. Small thrombus burden. There is dilation of right ventricular cavity suggesting possible right ventricular strain. There is no evidence of thoracic aortic dissection. There is no focal pulmonary consolidation. There are numerous space-occupying lesions of varying sizes and varying attenuation in the liver suggesting extensive hepatic metastatic disease. Fatty infiltration is seen in the liver. Electronically Signed   By: Elmer Picker M.D.   On: 02/11/2021 16:02   CT ABDOMEN PELVIS W CONTRAST  Result Date: 02/11/2021 CLINICAL DATA:  Abdominal pain. EXAM: CT ABDOMEN AND PELVIS WITH CONTRAST TECHNIQUE: Multidetector CT imaging of the abdomen and pelvis was performed using the standard protocol following bolus administration of intravenous contrast. RADIATION DOSE REDUCTION: This exam was performed according to the departmental dose-optimization program which includes automated exposure control, adjustment of the mA and/or kV according to patient size and/or use of iterative reconstruction technique.  CONTRAST:  152mL OMNIPAQUE IOHEXOL 350 MG/ML SOLN COMPARISON:  None. FINDINGS: Lower chest: Free motion artifact but no pulmonary lesions or pleural effusions. 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 Largest lesion in the left hepatic lobe on image number 18/3 measures 4.4 cm. Index lesion in the right hepatic lobe on image  35/3 measures 4.1 cm. The gallbladder is grossly normal.  No common bile duct dilatation. Pancreas: There is a 2.6 cm lesion in the pancreatic tail which could be the primary neoplasm. There is also a small lesion in the body tail junction region on image 32/3 which measures 10 mm. Spleen: Normal size.  No splenic lesions. Adrenals/Urinary Tract: No adrenal gland lesions. Small renal cysts. No worrisome renal lesions. The bladder is unremarkable. Stomach/Bowel: The stomach, duodenum, small bowel and terminal ileum are grossly normal. No acute inflammatory process, mass lesions or obstructive findings. No obvious colonic lesions to suggest a colonic primary. The appendix is normal. Vascular/Lymphatic: The aorta and branch vessels are patent. Scattered atherosclerotic calcifications. 2.2 cm the celiac axis lymph node on image 30/3. There are scattered borderline retroperitoneal lymph nodes without overt adenopathy. Nodular mesenteric implant on image 55/3 measures 2.1 cm. Mesenteric implant on image 57/3 measures 18 mm. Reproductive: Calcified right-sided fibroid. There is also a E 3.7 cm mass which could be a partially degenerated fibroid. Could not exclude the possibility of endometrial cancer. Pelvic ultrasound or MRI pelvis may be helpful for further evaluation. No adnexal masses. Prominent gonadal veins bilaterally. Other: Small amount of free pelvic fluid is noted. No inguinal adenopathy. Musculoskeletal: No significant bony findings. IMPRESSION: 1. Diffuse hepatic metastatic disease. 2. 2.6 cm lesion in the pancreatic tail could be the primary neoplasm. However there is a second lesion in the pancreatic body/tail junction region and this could reflect metastatic disease also. 3. Celiac axis lymphadenopathy and mesenteric implants. 4. Borderline retroperitoneal lymph nodes. 5. 3.7 cm mass in the uterus could be a partially degenerated fibroid. Could not exclude the possibility of any medial cancer. PET-CT may be  helpful to evaluate all of the above findings and to potentially guide a safe and appropriate biopsy. Electronically Signed   By: Marijo Sanes M.D.   On: 02/11/2021 16:07   US Venous Img Lower Unilateral Left (DVT)  Result Date: 02/03/2021 CLINICAL DATA:  Left lower extremity pain. EXAM: Left LOWER EXTREMITY VENOUS DOPPLER ULTRASOUND TECHNIQUE: Gray-scale sonography with graded compression, as well as color Doppler and duplex ultrasound were performed to evaluate the lower extremity deep venous systems from the level of the common femoral vein and including the common femoral, femoral, profunda femoral, popliteal and calf veins including the posterior tibial, peroneal and gastrocnemius veins when visible. The superficial great saphenous vein was also interrogated. Spectral Doppler was utilized to evaluate flow at rest and with distal augmentation maneuvers in the common femoral, femoral and popliteal veins. COMPARISON:  January 15, 2021. FINDINGS: Contralateral Common Femoral Vein: Respiratory phasicity is normal and symmetric with the symptomatic side. No evidence of thrombus. Normal compressibility. Common Femoral Vein: No evidence of thrombus. Normal compressibility, respiratory phasicity and response to augmentation. Saphenofemoral Junction: No evidence of thrombus. Normal compressibility and flow on color Doppler imaging. Profunda Femoral Vein: No evidence of thrombus. Normal compressibility and flow on color Doppler imaging. Femoral Vein: Noncompressible with no flow consistent with occlusive thrombus. Popliteal Vein: No evidence of thrombus. Normal compressibility, respiratory phasicity and response to augmentation. Calf Veins: Posterior tibial and peroneal veins are noncompressible consistent  with occlusive thrombus. Superficial Great Saphenous Vein: No evidence of thrombus. Normal compressibility. Venous Reflux:  None. Other Findings:  None. IMPRESSION: Occlusive deep venous thrombosis is noted in  left femoral, posterior tibial and peroneal veins. Electronically Signed   By: Marijo Conception M.D.   On: 02/03/2021 14:08   US Venous Img Lower Unilateral Left (DVT)  Result Date: 01/15/2021 CLINICAL DATA:  Left posterior thigh phlebitis. EXAM: LEFT LOWER EXTREMITY VENOUS DOPPLER ULTRASOUND TECHNIQUE: Gray-scale sonography with compression, as well as color and duplex ultrasound, were performed to evaluate the deep venous system(s) from the level of the common femoral vein through the popliteal and proximal calf veins. COMPARISON:  None. FINDINGS: VENOUS Normal compressibility of the common femoral, superficial femoral, and popliteal veins, as well as the visualized calf veins. Visualized portions of profunda femoral vein and great saphenous vein unremarkable. No filling defects to suggest DVT on grayscale or color Doppler imaging. Doppler waveforms show normal direction of venous flow, normal respiratory plasticity and response to augmentation. Limited views of the contralateral common femoral vein are unremarkable. Superficial vein thrombosis of the lesser saphenous vein in the thigh. OTHER Soft tissue edema of the left posterior thigh without evidence of drainable fluid collection. Limitations: none IMPRESSION: 1. No evidence of deep vein thrombosis. 2. Superficial vein thrombophlebitis of the small saphenous vein in the thigh. Electronically Signed   By: Keane Police D.O.   On: 01/15/2021 16:37   ECHOCARDIOGRAM COMPLETE  Result Date: 02/12/2021    ECHOCARDIOGRAM REPORT   Patient Name:   DRINA JOBST Date of Exam: 02/12/2021 Medical Rec #:  [id number]         Height:       60.0 in Accession #:    [id number]        Weight:       120.0 lb Date of Birth:  26-Dec-1945         BSA:          1.502 m Patient Age:    56 years          BP:           103/59 mmHg Patient Gender: F                 HR:           97 bpm. Exam Location:  Inpatient Procedure: 2D Echo Indications:    Pulmonary embolism  History:         Patient has no prior history of Echocardiogram examinations.  Sonographer:    Arlyss Gandy Referring Phys: [id number] RAVI PAHWANI IMPRESSIONS  1. Left ventricular ejection fraction, by estimation, is 60 to 65%. The left ventricle has normal function. The left ventricle has no regional wall motion abnormalities. Left ventricular diastolic parameters were normal.  2. Right ventricular systolic function is normal. The right ventricular size is normal. There is normal pulmonary artery systolic pressure.  3. The mitral valve is normal in structure. Trivial mitral valve regurgitation. No evidence of mitral stenosis.  4. The aortic valve is tricuspid. Aortic valve regurgitation is not visualized. No aortic stenosis is present.  5. The inferior vena cava is normal in size with greater than 50% respiratory variability, suggesting right atrial pressure of 3 mmHg. Comparison(s): No prior Echocardiogram. FINDINGS  Left Ventricle: Left ventricular ejection fraction, by estimation, is 60 to 65%. The left ventricle has normal function. The left ventricle has no regional wall motion abnormalities. The left ventricular internal cavity size was  normal in size. There is  no left ventricular hypertrophy. Left ventricular diastolic parameters were normal. Right Ventricle: The right ventricular size is normal. Right ventricular systolic function is normal. There is normal pulmonary artery systolic pressure. The tricuspid regurgitant velocity is 2.51 m/s, and with an assumed right atrial pressure of 3 mmHg,  the estimated right ventricular systolic pressure is 99.3 mmHg. Left Atrium: Left atrial size was normal in size. Right Atrium: Right atrial size was normal in size. Pericardium: There is no evidence of pericardial effusion. Mitral Valve: The mitral valve is normal in structure. Trivial mitral valve regurgitation. No evidence of mitral valve stenosis. Tricuspid Valve: The tricuspid valve is normal in structure. Tricuspid valve  regurgitation is mild . No evidence of tricuspid stenosis. Aortic Valve: The aortic valve is tricuspid. Aortic valve regurgitation is not visualized. No aortic stenosis is present. Aortic valve mean gradient measures 7.0 mmHg. Aortic valve peak gradient measures 13.8 mmHg. Pulmonic Valve: The pulmonic valve was normal in structure. Pulmonic valve regurgitation is not visualized. No evidence of pulmonic stenosis. Aorta: The aortic root is normal in size and structure. Venous: The inferior vena cava is normal in size with greater than 50% respiratory variability, suggesting right atrial pressure of 3 mmHg. IAS/Shunts: No atrial level shunt detected by color flow Doppler.  LEFT VENTRICLE PLAX 2D LVIDd:         3.80 cm   Diastology LVIDs:         2.70 cm   LV e' medial:    11.00 cm/s LV PW:         0.80 cm   LV E/e' medial:  9.6 LV IVS:        0.80 cm   LV e' lateral:   11.00 cm/s LVOT diam:     1.80 cm   LV E/e' lateral: 9.6 LV SV:         69 LV SV Index:   46 LVOT Area:     2.54 cm  RIGHT VENTRICLE             IVC RV Basal diam:  3.20 cm     IVC diam: 1.50 cm RV Mid diam:    2.50 cm RV S prime:     15.40 cm/s TAPSE (M-mode): 2.1 cm LEFT ATRIUM             Index        RIGHT ATRIUM           Index LA diam:        2.90 cm 1.93 cm/m   RA Area:     11.40 cm LA Vol (A2C):   30.4 ml 20.24 ml/m  RA Volume:   23.70 ml  15.78 ml/m LA Vol (A4C):   50.8 ml 33.81 ml/m LA Biplane Vol: 40.8 ml 27.16 ml/m  AORTIC VALVE AV Area (Vmax):    1.67 cm AV Area (Vmean):   1.78 cm AV Area (VTI):     1.89 cm AV Vmax:           186.00 cm/s AV Vmean:          120.000 cm/s AV VTI:            0.363 m AV Peak Grad:      13.8 mmHg AV Mean Grad:      7.0 mmHg LVOT Vmax:         122.00 cm/s LVOT Vmean:        83.900 cm/s LVOT VTI:  0.270 m LVOT/AV VTI ratio: 0.74  AORTA Ao Root diam: 2.60 cm Ao Asc diam:  2.60 cm MITRAL VALVE                TRICUSPID VALVE MV Area (PHT): 2.79 cm     TR Peak grad:   25.2 mmHg MV Decel Time: 272 msec      TR Vmax:        251.00 cm/s MV E velocity: 106.00 cm/s MV A velocity: 94.70 cm/s   SHUNTS MV E/A ratio:  1.12         Systemic VTI:  0.27 m                             Systemic Diam: 1.80 cm Kirk Ruths MD Electronically signed by Kirk Ruths MD Signature Date/Time: 02/12/2021/11:40:05 AM    Final     Assessment and Plan:  This is a 76 year old female who presented with abdominal pain and was found to have imaging findings concerning for metastatic disease to the liver as well as pancreatic lesions and adenopathy.  She was diagnosed with a DVT about 1 week prior to admission started on Eliquis.  Found to have PE this admission.  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  CA 19-9 is significantly elevated.  Liver biopsy pending.  1.  Pancreatic mass and liver lesions concerning for metastatic malignancy 2.  DVT and PE 3.  Normocytic anemia 4.  Leukocytosis, likely reactive 5.  Thrombocytosis, likely reactive 6.  Transaminitis 7.  Protein calorie malnutrition  -Discussed imaging findings and concern that imaging findings are highly suspicious for metastatic pancreatic cancer.  The patient was extremely upset regarding this news and we had a limited discussion today. We had some discussion regarding the incurable nature of her disease and that her disease is not resectable.  Treatment options would consider systemic chemotherapy versus best supportive care.  Goal of systemic treatment would be palliative.  She has expressed interest in pursuing systemic treatment if offered. -Will await biopsy results. -She is having significant back pain today and I have reordered her Dilaudid 0.5 to 1 mg IV every 3 hours.  I have placed a palliative care consult to assist with pain medication management. -Recommend dietitian consult to optimize nutrition. -Recommend PT/OT evaluation when off bed rest. -She will need genetic counseling as an outpatient. -The patient  was found to have a recent DVT and now diagnosed with PE.  Likely hypercoagulable related to underlying malignancy.  She has been on Eliquis and less likely that this is a treatment failure of Eliquis and more likely that the PE was present at the time of DVT was initially diagnosed.  Therefore, would recommend transitioning from heparin back to Eliquis once procedures are completed.  Thank you for this referral.   Mikey Bussing, DNP, AGPCNP-BC, AOCNP   Attending Note  I personally saw the patient, reviewed the chart and examined the patient. The plan of care was discussed with the patient and the admitting team. I agree with the assessment and plan as documented above. Thank you very much for the consultation.  I met patient, her son Angie Rojas and his wife. It was a very lengthy conversation, patient is extremely anxious. I discussed that this clinical presentation is highly suspicious for metastatic pancreatic cancer although pathology is pending. She wants to do everything if she can. She wants to try as best as she can  to live longer. She says she has been having some symptoms such as heart burn for many months but thought this was from fosamax. She had some back pain and fatigue for at least a couple months, she is a bus driver for the schools, she said it took her longer to do anything because of this. She however noticed severe worsening of back pain in the past few weeks. She also had abdominal pain, weight loss of approximately 10 lbs since last visit. FH with no pancreatic cancer, brother had melanoma, dad had bladder cancer, maternal aunt had breast cancer We discussed about chemotherapy with a palliative intent We discussed prognosis and median OS with different treatment options. I explained that she is not the best candidate for FOLFIRINOX, we can certainly try gem/abraxane  Recommended genetic testing Please engage palliative care inpatient for pain management, unless already  consulted Please consider IR port inpatient to expedite process Once pain is managed adequately, if she can be discharged we would like to start outpatient treatment in the next week to 10 days. We will arrange for follow up outpatient.

## 2021-02-12 NOTE — Consult Note (Signed)
Chief Complaint: Patient was seen in consultation today for liver lesions  Referring Physician(s): Dr. Doristine Bosworth  Supervising Physician: Sandi Mariscal  Patient Status: [facility] - ED  History of Present Illness: Angie Rojas is a 76 y.o. female with past medical history of anxiety and recent diagnosis of left femoral vein DVT on Eliquis presented to ED with severe abdominal pain radiating into her back. She was found to have new right lower lobe PE as well as pancreatic mass with diffuse hepatic metastases. IR consulted for liver lesion biopsy at the request of Dr. Doristine Bosworth. Case reviewed by Dr. Pascal Lux who approves patient for liver lesion biopsy.   PA with patient at bedside. She is anxious and tangential, however she is oriented and is clearly able to demonstrate understanding of her new imaging findings.   She confirms the above history and reports she last took her Eliquis yesterday.  She is tolerating clear liquids today.    History reviewed. No pertinent past medical history.  History reviewed. No pertinent surgical history.  Allergies: Patient has no known allergies.  Medications: Prior to Admission medications   Medication Sig Start Date End Date Taking? Authorizing Provider  ELIQUIS 5 MG TABS tablet Take 5 mg by mouth 2 (two) times daily. 02/03/21  Yes [provider]  Multiple Vitamin (MULTIVITAMIN WITH MINERALS) TABS tablet Take 1 tablet by mouth daily.   Yes [provider]  sodium chloride (OCEAN) 0.65 % SOLN nasal spray Place 1 spray into both nostrils 3 (three) times daily.   Yes [provider]  traZODone (DESYREL) 50 MG tablet Take 25 mg by mouth at bedtime. 02/10/21  Yes [provider]     History reviewed. No pertinent family history.  Social History   Socioeconomic History   Marital status: Married    Spouse name: Not on file   Number of children: Not on file   Years of education: Not on file   Highest education level:  Not on file  Occupational History   Not on file  Tobacco Use   Smoking status: Not on file   Smokeless tobacco: Not on file  Substance and Sexual Activity   Alcohol use: Not on file   Drug use: Not on file   Sexual activity: Not on file  Other Topics Concern   Not on file  Social History Narrative   Not on file   Social Determinants of Health   Financial Resource Strain: Not on file  Food Insecurity: Not on file  Transportation Needs: Not on file  Physical Activity: Not on file  Stress: Not on file  Social Connections: Not on file     Review of Systems: A 12 point ROS discussed and pertinent positives are indicated in the HPI above.  All other systems are negative.  Review of Systems  Constitutional:  Negative for fatigue and fever.  Respiratory:  Negative for cough and shortness of breath.   Gastrointestinal:  Positive for abdominal pain. Negative for nausea and vomiting.  Musculoskeletal:  Negative for back pain.  Psychiatric/Behavioral:  Negative for behavioral problems and confusion.    Vital Signs: BP 99/66    Pulse 96    Temp 98.8 F (37.1 C) (Oral)    Resp 19    Ht 5' (1.524 m)    Wt 120 lb (54.4 kg)    SpO2 92%    BMI 23.44 kg/m   Physical Exam Vitals and nursing note reviewed.  Constitutional:  General: She is not in acute distress.    Appearance: She is well-developed. She is not ill-appearing.  Cardiovascular:     Rate and Rhythm: Normal rate and regular rhythm.  Skin:    General: Skin is warm and dry.  Neurological:     General: No focal deficit present.     Mental Status: She is alert and oriented to person, place, and time.  Psychiatric:        Mood and Affect: Mood is anxious.        Behavior: Behavior normal.     MD Evaluation Airway: WNL Heart: WNL Abdomen: WNL Chest/ Lungs: WNL ASA  Classification: 3 Mallampati/Airway Score: Two   Imaging: DG Chest 2 View  Result Date: 02/11/2021 CLINICAL DATA:  Shortness of breath and  vomiting.  DVT. EXAM: CHEST - 2 VIEW COMPARISON:  None. FINDINGS: Trachea is midline. Heart size normal. Lungs are clear. No pleural fluid. IMPRESSION: No acute findings. Electronically Signed   By: Lorin Picket M.D.   On: 02/11/2021 13:50   CT Head Wo Contrast  Result Date: 02/11/2021 CLINICAL DATA:  Mental status change.  Rule out metastatic disease EXAM: CT HEAD WITHOUT CONTRAST TECHNIQUE: Contiguous axial images were obtained from the base of the skull through the vertex without intravenous contrast. RADIATION DOSE REDUCTION: This exam was performed according to the departmental dose-optimization program which includes automated exposure control, adjustment of the mA and/or kV according to patient size and/or use of iterative reconstruction technique. COMPARISON:  MRI head 07/03/2013 FINDINGS: Brain: No evidence of acute infarction, hemorrhage, hydrocephalus, extra-axial collection or mass lesion/mass effect. Vascular: Negative for hyperdense vessel Skull: Negative Sinuses/Orbits: Paranasal sinuses clear.  Negative orbit Other: None IMPRESSION: Negative CT head Electronically Signed   By: Franchot Gallo M.D.   On: 02/11/2021 18:07   CT Angio Chest PE W and/or Wo Contrast  Result Date: 02/11/2021 CLINICAL DATA:  Shortness of breath, history of DVT in the left lower extremity EXAM: CT ANGIOGRAPHY CHEST WITH CONTRAST TECHNIQUE: Multidetector CT imaging of the chest was performed using the standard protocol during bolus administration of intravenous contrast. Multiplanar CT image reconstructions and MIPs were obtained to evaluate the vascular anatomy. RADIATION DOSE REDUCTION: This exam was performed according to the departmental dose-optimization program which includes automated exposure control, adjustment of the mA and/or kV according to patient size and/or use of iterative reconstruction technique. CONTRAST:  19mL OMNIPAQUE IOHEXOL 350 MG/ML SOLN COMPARISON:  None. FINDINGS: Cardiovascular: Contrast  density in the pulmonary artery branches is less than optimal. There is more contrast enhancement in the systemic vessels suggesting less than optimal timing of the imaging sequence. As far as seen, there are no filling defects in central pulmonary artery branches. There are few intraluminal filling defects in the segmental and subsegmental branches in the right lower lung fields with small thrombus burden. There is no ectasia of main pulmonary artery. Right ventricular cavity is more prominent than usual suggesting possible right ventricular strain. Midportion of right ventricular cavity measures 3.7 cm in diameter. Mediastinum/Nodes: No significant lymphadenopathy seen. Lungs/Pleura: There is no focal pulmonary consolidation. There is no pleural effusion or pneumothorax. Small linear densities in the lower lung fields suggest scarring or subsegmental atelectasis. Upper Abdomen: There are numerous space-occupying lesions of varying sizes in the visualized upper portions of liver measuring up to 5.3 cm in diameter. There is fatty infiltration in the liver. Musculoskeletal: Unremarkable. Review of the MIP images confirms the above findings. IMPRESSION: Acute pulmonary embolism in few  segmental and subsegmental branches in the right lower lobe. Small thrombus burden. There is dilation of right ventricular cavity suggesting possible right ventricular strain. There is no evidence of thoracic aortic dissection. There is no focal pulmonary consolidation. There are numerous space-occupying lesions of varying sizes and varying attenuation in the liver suggesting extensive hepatic metastatic disease. Fatty infiltration is seen in the liver. Electronically Signed   By: Elmer Picker M.D.   On: 02/11/2021 16:02   CT ABDOMEN PELVIS W CONTRAST  Result Date: 02/11/2021 CLINICAL DATA:  Abdominal pain. EXAM: CT ABDOMEN AND PELVIS WITH CONTRAST TECHNIQUE: Multidetector CT imaging of the abdomen and pelvis was performed  using the standard protocol following bolus administration of intravenous contrast. RADIATION DOSE REDUCTION: This exam was performed according to the departmental dose-optimization program which includes automated exposure control, adjustment of the mA and/or kV according to patient size and/or use of iterative reconstruction technique. CONTRAST:  15mL OMNIPAQUE IOHEXOL 350 MG/ML SOLN COMPARISON:  None. FINDINGS: Lower chest: Free motion artifact but no pulmonary lesions or pleural effusions. 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 Largest lesion in the left hepatic lobe on image number 18/3 measures 4.4 cm. Index lesion in the right hepatic lobe on image 35/3 measures 4.1 cm. The gallbladder is grossly normal.  No common bile duct dilatation. Pancreas: There is a 2.6 cm lesion in the pancreatic tail which could be the primary neoplasm. There is also a small lesion in the body tail junction region on image 32/3 which measures 10 mm. Spleen: Normal size.  No splenic lesions. Adrenals/Urinary Tract: No adrenal gland lesions. Small renal cysts. No worrisome renal lesions. The bladder is unremarkable. Stomach/Bowel: The stomach, duodenum, small bowel and terminal ileum are grossly normal. No acute inflammatory process, mass lesions or obstructive findings. No obvious colonic lesions to suggest a colonic primary. The appendix is normal. Vascular/Lymphatic: The aorta and branch vessels are patent. Scattered atherosclerotic calcifications. 2.2 cm the celiac axis lymph node on image 30/3. There are scattered borderline retroperitoneal lymph nodes without overt adenopathy. Nodular mesenteric implant on image 55/3 measures 2.1 cm. Mesenteric implant on image 57/3 measures 18 mm. Reproductive: Calcified right-sided fibroid. There is also a E 3.7 cm mass which could be a partially degenerated fibroid. Could not exclude the possibility of endometrial cancer. Pelvic ultrasound or MRI  pelvis may be helpful for further evaluation. No adnexal masses. Prominent gonadal veins bilaterally. Other: Small amount of free pelvic fluid is noted. No inguinal adenopathy. Musculoskeletal: No significant bony findings. IMPRESSION: 1. Diffuse hepatic metastatic disease. 2. 2.6 cm lesion in the pancreatic tail could be the primary neoplasm. However there is a second lesion in the pancreatic body/tail junction region and this could reflect metastatic disease also. 3. Celiac axis lymphadenopathy and mesenteric implants. 4. Borderline retroperitoneal lymph nodes. 5. 3.7 cm mass in the uterus could be a partially degenerated fibroid. Could not exclude the possibility of any medial cancer. PET-CT may be helpful to evaluate all of the above findings and to potentially guide a safe and appropriate biopsy. Electronically Signed   By: Marijo Sanes M.D.   On: 02/11/2021 16:07   US Venous Img Lower Unilateral Left (DVT)  Result Date: 02/03/2021 CLINICAL DATA:  Left lower extremity pain. EXAM: Left LOWER EXTREMITY VENOUS DOPPLER ULTRASOUND TECHNIQUE: Gray-scale sonography with graded compression, as well as color Doppler and duplex ultrasound were performed to evaluate the lower extremity deep venous systems from the level of the common femoral vein  and including the common femoral, femoral, profunda femoral, popliteal and calf veins including the posterior tibial, peroneal and gastrocnemius veins when visible. The superficial great saphenous vein was also interrogated. Spectral Doppler was utilized to evaluate flow at rest and with distal augmentation maneuvers in the common femoral, femoral and popliteal veins. COMPARISON:  January 15, 2021. FINDINGS: Contralateral Common Femoral Vein: Respiratory phasicity is normal and symmetric with the symptomatic side. No evidence of thrombus. Normal compressibility. Common Femoral Vein: No evidence of thrombus. Normal compressibility, respiratory phasicity and response to  augmentation. Saphenofemoral Junction: No evidence of thrombus. Normal compressibility and flow on color Doppler imaging. Profunda Femoral Vein: No evidence of thrombus. Normal compressibility and flow on color Doppler imaging. Femoral Vein: Noncompressible with no flow consistent with occlusive thrombus. Popliteal Vein: No evidence of thrombus. Normal compressibility, respiratory phasicity and response to augmentation. Calf Veins: Posterior tibial and peroneal veins are noncompressible consistent with occlusive thrombus. Superficial Great Saphenous Vein: No evidence of thrombus. Normal compressibility. Venous Reflux:  None. Other Findings:  None. IMPRESSION: Occlusive deep venous thrombosis is noted in left femoral, posterior tibial and peroneal veins. Electronically Signed   By: Marijo Conception M.D.   On: 02/03/2021 14:08   US Venous Img Lower Unilateral Left (DVT)  Result Date: 01/15/2021 CLINICAL DATA:  Left posterior thigh phlebitis. EXAM: LEFT LOWER EXTREMITY VENOUS DOPPLER ULTRASOUND TECHNIQUE: Gray-scale sonography with compression, as well as color and duplex ultrasound, were performed to evaluate the deep venous system(s) from the level of the common femoral vein through the popliteal and proximal calf veins. COMPARISON:  None. FINDINGS: VENOUS Normal compressibility of the common femoral, superficial femoral, and popliteal veins, as well as the visualized calf veins. Visualized portions of profunda femoral vein and great saphenous vein unremarkable. No filling defects to suggest DVT on grayscale or color Doppler imaging. Doppler waveforms show normal direction of venous flow, normal respiratory plasticity and response to augmentation. Limited views of the contralateral common femoral vein are unremarkable. Superficial vein thrombosis of the lesser saphenous vein in the thigh. OTHER Soft tissue edema of the left posterior thigh without evidence of drainable fluid collection. Limitations: none  IMPRESSION: 1. No evidence of deep vein thrombosis. 2. Superficial vein thrombophlebitis of the small saphenous vein in the thigh. Electronically Signed   By: Keane Police D.O.   On: 01/15/2021 16:37   ECHOCARDIOGRAM COMPLETE  Result Date: 02/12/2021    ECHOCARDIOGRAM REPORT   Patient Name:   DAISHIA FETTERLY Date of Exam: 02/12/2021 Medical Rec #:  [id number]         Height:       60.0 in Accession #:    [id number]        Weight:       120.0 lb Date of Birth:  July 13, 1945         BSA:          1.502 m Patient Age:    44 years          BP:           103/59 mmHg Patient Gender: F                 HR:           97 bpm. Exam Location:  Inpatient Procedure: 2D Echo Indications:    Pulmonary embolism  History:        Patient has no prior history of Echocardiogram examinations.  Sonographer:    Arlyss Gandy Referring Phys: [phone]  RAVI PAHWANI IMPRESSIONS  1. Left ventricular ejection fraction, by estimation, is 60 to 65%. The left ventricle has normal function. The left ventricle has no regional wall motion abnormalities. Left ventricular diastolic parameters were normal.  2. Right ventricular systolic function is normal. The right ventricular size is normal. There is normal pulmonary artery systolic pressure.  3. The mitral valve is normal in structure. Trivial mitral valve regurgitation. No evidence of mitral stenosis.  4. The aortic valve is tricuspid. Aortic valve regurgitation is not visualized. No aortic stenosis is present.  5. The inferior vena cava is normal in size with greater than 50% respiratory variability, suggesting right atrial pressure of 3 mmHg. Comparison(s): No prior Echocardiogram. FINDINGS  Left Ventricle: Left ventricular ejection fraction, by estimation, is 60 to 65%. The left ventricle has normal function. The left ventricle has no regional wall motion abnormalities. The left ventricular internal cavity size was normal in size. There is  no left ventricular hypertrophy. Left ventricular  diastolic parameters were normal. Right Ventricle: The right ventricular size is normal. Right ventricular systolic function is normal. There is normal pulmonary artery systolic pressure. The tricuspid regurgitant velocity is 2.51 m/s, and with an assumed right atrial pressure of 3 mmHg,  the estimated right ventricular systolic pressure is 14.7 mmHg. Left Atrium: Left atrial size was normal in size. Right Atrium: Right atrial size was normal in size. Pericardium: There is no evidence of pericardial effusion. Mitral Valve: The mitral valve is normal in structure. Trivial mitral valve regurgitation. No evidence of mitral valve stenosis. Tricuspid Valve: The tricuspid valve is normal in structure. Tricuspid valve regurgitation is mild . No evidence of tricuspid stenosis. Aortic Valve: The aortic valve is tricuspid. Aortic valve regurgitation is not visualized. No aortic stenosis is present. Aortic valve mean gradient measures 7.0 mmHg. Aortic valve peak gradient measures 13.8 mmHg. Pulmonic Valve: The pulmonic valve was normal in structure. Pulmonic valve regurgitation is not visualized. No evidence of pulmonic stenosis. Aorta: The aortic root is normal in size and structure. Venous: The inferior vena cava is normal in size with greater than 50% respiratory variability, suggesting right atrial pressure of 3 mmHg. IAS/Shunts: No atrial level shunt detected by color flow Doppler.  LEFT VENTRICLE PLAX 2D LVIDd:         3.80 cm   Diastology LVIDs:         2.70 cm   LV e' medial:    11.00 cm/s LV PW:         0.80 cm   LV E/e' medial:  9.6 LV IVS:        0.80 cm   LV e' lateral:   11.00 cm/s LVOT diam:     1.80 cm   LV E/e' lateral: 9.6 LV SV:         69 LV SV Index:   46 LVOT Area:     2.54 cm  RIGHT VENTRICLE             IVC RV Basal diam:  3.20 cm     IVC diam: 1.50 cm RV Mid diam:    2.50 cm RV S prime:     15.40 cm/s TAPSE (M-mode): 2.1 cm LEFT ATRIUM             Index        RIGHT ATRIUM           Index LA diam:         2.90 cm 1.93 cm/m  RA Area:     11.40 cm LA Vol (A2C):   30.4 ml 20.24 ml/m  RA Volume:   23.70 ml  15.78 ml/m LA Vol (A4C):   50.8 ml 33.81 ml/m LA Biplane Vol: 40.8 ml 27.16 ml/m  AORTIC VALVE AV Area (Vmax):    1.67 cm AV Area (Vmean):   1.78 cm AV Area (VTI):     1.89 cm AV Vmax:           186.00 cm/s AV Vmean:          120.000 cm/s AV VTI:            0.363 m AV Peak Grad:      13.8 mmHg AV Mean Grad:      7.0 mmHg LVOT Vmax:         122.00 cm/s LVOT Vmean:        83.900 cm/s LVOT VTI:          0.270 m LVOT/AV VTI ratio: 0.74  AORTA Ao Root diam: 2.60 cm Ao Asc diam:  2.60 cm MITRAL VALVE                TRICUSPID VALVE MV Area (PHT): 2.79 cm     TR Peak grad:   25.2 mmHg MV Decel Time: 272 msec     TR Vmax:        251.00 cm/s MV E velocity: 106.00 cm/s MV A velocity: 94.70 cm/s   SHUNTS MV E/A ratio:  1.12         Systemic VTI:  0.27 m                             Systemic Diam: 1.80 cm Kirk Ruths MD Electronically signed by Kirk Ruths MD Signature Date/Time: 02/12/2021/11:40:05 AM    Final     Labs:  CBC: Recent Labs    02/11/21 1324 02/12/21 0326  WBC 19.4* 18.1*  HGB 9.2* 8.1*  HCT 29.5* 27.0*  PLT 542* 460*    COAGS: Recent Labs    02/12/21 0325  APTT 37*    BMP: Recent Labs    02/11/21 1324 02/12/21 0326  NA 135 132*  K 3.9 3.7  CL 95* 103  CO2 23 22  GLUCOSE 116* 113*  BUN 11 8  CALCIUM 9.7 8.0*  CREATININE 0.55 0.52  GFRNONAA >60 >60    LIVER FUNCTION TESTS: Recent Labs    02/11/21 1324 02/12/21 0326  BILITOT 0.9 0.6  AST 61* 56*  ALT 59* 50*  ALKPHOS 413* 334*  PROT 6.8 5.6*  ALBUMIN 2.7* 2.2*    TUMOR MARKERS: No results for input(s): AFPTM, CEA, CA199, CHROMGRNA in the last 8760 hours.  Assessment and Plan: Pancreatic mass, diffuse hepatic metastasis Patient recently admitted with LLE DVT, now with subsegmental right PE and newly found pancreatic and liver masses. Admitted for initiation of anticoagulation given her lack of  response to Eliquis at home. She last took Eliquis yesterday morning.  IR consulted for liver lesion biopsy at the request of Dr. Doristine Bosworth.  Hold Eliquis, continue heparin drip.  NPO p MN.  Risks and benefits of liver lesion biopsy was discussed with the patient and/or patient's family including, but not limited to bleeding, infection, damage to adjacent structures or low yield requiring additional tests.  All of the questions were answered and there is agreement to proceed.  Consent signed and in chart.  Thank you for this  interesting consult.  I greatly enjoyed meeting Angie Rojas and look forward to participating in their care.  A copy of this report was sent to the requesting provider on this date.  Electronically Signed: Docia Barrier, PA 02/12/2021, 12:47 PM   I spent a total of 40 Minutes    in face to face in clinical consultation, greater than 50% of which was counseling/coordinating care for liver lesion.

## 2021-02-12 NOTE — Progress Notes (Signed)
Echocardiogram 2D Echocardiogram has been performed.  Angie Rojas 02/12/2021, 8:36 AM

## 2021-02-12 NOTE — Progress Notes (Signed)
[place] for IV heparin Indication: pulmonary embolus  No Known Allergies  Patient Measurements: Height: 5' (152.4 cm) Weight: 54.4 kg (120 lb) IBW/kg (Calculated) : 45.5 Heparin Dosing Weight: 54.4kg  Vital Signs: BP: 109/58 (01/19 0300) Pulse Rate: 94 (01/19 0350)  Labs: Recent Labs    02/11/21 1324 02/11/21 1628 02/12/21 0325  HGB 9.2*  --   --   HCT 29.5*  --   --   PLT 542*  --   --   APTT  --   --  37*  HEPARINUNFRC  --   --  >1.10*  CREATININE 0.55  --   --   TROPONINIHS 8 8  --      Estimated Creatinine Clearance: 45 mL/min (by C-G formula based on SCr of 0.55 mg/dL).   Assessment: 8 YOF presenting with increased shortness of breath to the ED. PMH significant for newly diagnosed DVT in left femoral posterior tibial and peroneal veins on 02/03/2021. Pt was started on Eliquis 5 mg BID, last dose taken at 0900 02/11/21. CT angio positive for acute PE in right lower lobe with possible right ventricular strain. Pharmacy to dose IV heparin.  Due to possible right heart strain and last dose of Eliquis on 1/18, only half bolus of IV heparin was administered. Currently on heparin infusion at 900 units/hr. Initial aPTT and HL do no correlate due to apixaban effect on HL. Will follow aPTT for now. RN reports no s/s of overt bleeding   Goal of Therapy:  Heparin level 0.3-0.7 units/ml aPTT 66-102 seconds Monitor platelets by anticoagulation protocol: Yes   Plan:  IV heparin 1000 unit bolus x1 Increase IV heparin 1100 units/h 8h heparin level, aPTT Daily heparin level, aPTT, CBC Monitor for signs/symptoms of bleeding Follow-up long-term anticoagulation plan  Thank you for involving pharmacy in this patient's care.  Albertina Parr, PharmD., BCPS, BCCCP Clinical Pharmacist Please refer to [facility] for unit-specific pharmacist

## 2021-02-12 NOTE — ED Notes (Signed)
Pt SpO2 88% after Dilaudid. Placed on 2L increased to 100%.

## 2021-02-13 ENCOUNTER — Other Ambulatory Visit (HOSPITAL_COMMUNITY): Payer: Self-pay

## 2021-02-13 ENCOUNTER — Inpatient Hospital Stay (HOSPITAL_COMMUNITY): Payer: Medicare Other

## 2021-02-13 DIAGNOSIS — I2699 Other pulmonary embolism without acute cor pulmonale: Secondary | ICD-10-CM | POA: Diagnosis not present

## 2021-02-13 LAB — BASIC METABOLIC PANEL
Anion gap: 7 (ref 5–15)
BUN: 5 mg/dL — ABNORMAL LOW (ref 8–23)
CO2: 26 mmol/L (ref 22–32)
Calcium: 8.6 mg/dL — ABNORMAL LOW (ref 8.9–10.3)
Chloride: 104 mmol/L (ref 98–111)
Creatinine, Ser: 0.54 mg/dL (ref 0.44–1.00)
GFR, Estimated: >60 mL/min (ref >60)
Glucose, Bld: 121 mg/dL — ABNORMAL HIGH (ref 70–99)
Potassium: 4.1 mmol/L (ref 3.5–5.1)
Sodium: 137 mmol/L (ref 135–145)

## 2021-02-13 LAB — CBC
HCT: 26.4 % — ABNORMAL LOW (ref 36.0–46.0)
Hemoglobin: 8.3 g/dL — ABNORMAL LOW (ref 12.0–15.0)
MCH: 28.2 pg (ref 26.0–34.0)
MCHC: 31.4 g/dL (ref 30.0–36.0)
MCV: 89.8 fL (ref 80.0–100.0)
Platelets: 424 10*3/uL — ABNORMAL HIGH (ref 150–400)
RBC: 2.94 MIL/uL — ABNORMAL LOW (ref 3.87–5.11)
RDW: 13.4 % (ref 11.5–15.5)
WBC: 15.7 10*3/uL — ABNORMAL HIGH (ref 4.0–10.5)
nRBC: 0 % (ref 0.0–0.2)

## 2021-02-13 LAB — CANCER ANTIGEN 19-9: CA 19-9: 84088 U/mL — ABNORMAL HIGH (ref 0–35)

## 2021-02-13 LAB — APTT: aPTT: 85 seconds — ABNORMAL HIGH (ref 24–36)

## 2021-02-13 LAB — HEPARIN LEVEL (UNFRACTIONATED): Heparin Unfractionated: 0.66 IU/mL (ref 0.30–0.70)

## 2021-02-13 LAB — CEA: CEA: 70 ng/mL — ABNORMAL HIGH (ref 0.0–4.7)

## 2021-02-13 MED ORDER — MIDAZOLAM HCL 2 MG/2ML IJ SOLN
INTRAMUSCULAR | Status: AC | PRN
  Administered 2021-02-13: 1 mg via INTRAVENOUS

## 2021-02-13 MED ORDER — HYDROCODONE-ACETAMINOPHEN 5-325 MG PO TABS
1.0000 | ORAL_TABLET | Freq: Four times a day (QID) | ORAL | Status: DC | PRN
  Administered 2021-02-13 – 2021-02-14 (×5): 1 via ORAL
  Filled 2021-02-13 (×6): qty 1

## 2021-02-13 MED ORDER — FENTANYL CITRATE (PF) 100 MCG/2ML IJ SOLN
INTRAMUSCULAR | Status: AC
  Filled 2021-02-13: qty 2

## 2021-02-13 MED ORDER — HEPARIN (PORCINE) 25000 UT/250ML-% IV SOLN
1700.0000 [IU]/h | INTRAVENOUS | Status: DC
  Administered 2021-02-13 (×2): 1250 [IU]/h via INTRAVENOUS
  Filled 2021-02-13 (×4): qty 250

## 2021-02-13 MED ORDER — FENTANYL CITRATE (PF) 100 MCG/2ML IJ SOLN
INTRAMUSCULAR | Status: AC | PRN
Start: 2021-02-13 — End: 2021-02-13
  Administered 2021-02-13: 25 ug via INTRAVENOUS

## 2021-02-13 MED ORDER — GELATIN ABSORBABLE 12-7 MM EX MISC
CUTANEOUS | Status: AC
  Filled 2021-02-13: qty 1

## 2021-02-13 MED ORDER — LIDOCAINE HCL (PF) 1 % IJ SOLN
INTRAMUSCULAR | Status: AC
  Filled 2021-02-13: qty 30

## 2021-02-13 MED ORDER — MIDAZOLAM HCL 2 MG/2ML IJ SOLN
INTRAMUSCULAR | Status: AC
  Filled 2021-02-13: qty 2

## 2021-02-13 MED ORDER — HYDROMORPHONE HCL 1 MG/ML IJ SOLN
0.5000 mg | INTRAMUSCULAR | Status: AC | PRN
  Administered 2021-02-13 (×2): 1 mg via INTRAVENOUS
  Filled 2021-02-13 (×2): qty 1

## 2021-02-13 NOTE — Progress Notes (Signed)
Oncology Discharge Planning Admission Note  [facility] at [facility] Address: [address], [place], [place] [address] Hours of Operation:  8am - 5pm, Monday - Friday  Clinic Contact Information:  [phone]  Oncology Care Team: Medical Oncologist:     Oncology provider Myrtha Mantis, NP is aware of this hospital admission dated 02/11/21 and has assessed at bedside.  The cancer center will follow Angie Rojas inpatient care to assist with discharge planning as indicated by the oncologist.  We will reach out to closer to discharge date to arrange follow up care.  Disclaimer:  This [place] note does not imply a formal consult request has been made by the admitting attending for this admission or there will be an inpatient consult completed by oncology.  Please request oncology consults as per standard process as indicated.

## 2021-02-13 NOTE — Plan of Care (Signed)

## 2021-02-13 NOTE — Procedures (Signed)
Interventional Radiology Procedure Note  Procedure: [place] CORE BX RT LIVER MET    Complications: None  Estimated Blood Loss:  MIN  Findings: 18 G CORES X 2  RESTART HEPARIN AT 1484     M. Daryll Brod, MD

## 2021-02-13 NOTE — Care Management Important Message (Signed)
Important Message  Patient Details  Name: Angie Rojas MRN: [id number] Date of Birth: 08/23/1945   Medicare Important Message Given:  Yes     Hannah Beat 02/13/2021, 1:51 PM

## 2021-02-13 NOTE — Progress Notes (Signed)
[place] for IV heparin Indication: pulmonary embolus  No Known Allergies  Patient Measurements: Height: 5' (152.4 cm) Weight: 54.4 kg (120 lb) IBW/kg (Calculated) : 45.5 Heparin Dosing Weight: 54.4kg  Vital Signs: Temp: 100.3 F (37.9 C) (01/19 2200) Temp Source: Oral (01/19 2200) BP: 115/61 (01/19 2200) Pulse Rate: 104 (01/19 2200)  Labs: Recent Labs    02/11/21 1324 02/11/21 1628 02/12/21 0325 02/12/21 0326 02/12/21 1518 02/13/21 0143  HGB 9.2*  --   --  8.1*  --  8.3*  HCT 29.5*  --   --  27.0*  --  26.4*  PLT 542*  --   --  460*  --  424*  APTT  --   --  37*  --  51* 85*  HEPARINUNFRC  --   --  >1.10*  --   --  0.66  CREATININE 0.55  --   --  0.52  --  0.54  TROPONINIHS 8 8  --   --   --   --      Estimated Creatinine Clearance: 45 mL/min (by C-G formula based on SCr of 0.54 mg/dL).   Assessment: 59 YOF presenting with increased shortness of breath to the ED. PMH significant for newly diagnosed DVT in left femoral posterior tibial and peroneal veins on 02/03/2021. Pt was started on Eliquis 5 mg BID, last dose taken at 0900 02/11/21. CT angio positive for acute PE in right lower lobe with possible right ventricular strain. Pharmacy to dose IV heparin.  Heparin level and aPTT are both therapeutic and seem to be correlating. Will stop monitoring aPTT moving forward. H/H and Plt stable. RN reports no s/s of bleeding   Goal of Therapy:  Heparin level 0.3-0.7 units/ml Monitor platelets by anticoagulation protocol: Yes   Plan:  Continue IV heparin 1250 units/h 8h heparin level Daily heparin level, CBC Monitor for signs/symptoms of bleeding Follow-up long-term anticoagulation plan  Thank you for involving pharmacy in this patient's care.  Albertina Parr, PharmD., BCPS, BCCCP Clinical Pharmacist Please refer to [facility] for unit-specific pharmacist

## 2021-02-13 NOTE — Progress Notes (Signed)
[name] for IV heparin Indication: pulmonary embolus  No Known Allergies  Patient Measurements: Height: 5' (152.4 cm) Weight: 54.4 kg (120 lb) IBW/kg (Calculated) : 45.5 Heparin Dosing Weight: 54.4kg  Vital Signs: Temp: 97.8 F (36.6 C) (01/20 0741) Temp Source: Oral (01/20 0445) BP: 132/53 (01/20 0741) Pulse Rate: 102 (01/20 0741)  Labs: Recent Labs    02/11/21 1324 02/11/21 1628 02/12/21 0325 02/12/21 0326 02/12/21 1518 02/13/21 0143  HGB 9.2*  --   --  8.1*  --  8.3*  HCT 29.5*  --   --  27.0*  --  26.4*  PLT 542*  --   --  460*  --  424*  APTT  --   --  37*  --  51* 85*  HEPARINUNFRC  --   --  >1.10*  --   --  0.66  CREATININE 0.55  --   --  0.52  --  0.54  TROPONINIHS 8 8  --   --   --   --      Estimated Creatinine Clearance: 45 mL/min (by C-G formula based on SCr of 0.54 mg/dL).   Assessment: 67 YOF presenting with increased shortness of breath to the ED. PMH significant for newly diagnosed DVT in left femoral posterior tibial and peroneal veins on 02/03/2021. Pt was started on Eliquis 5 mg BID, last dose taken at 0900 02/11/21. CT angio positive for acute PE in right lower lobe with possible right ventricular strain. Pharmacy to dose IV heparin.  Heparin level and aPTT are both therapeutic and seem to be correlating. H/H and Plt stable. RN reports no s/s of bleeding.  Goal of Therapy:  Heparin level 0.3-0.7 units/ml Monitor platelets by anticoagulation protocol: Yes   Plan:  Heparin held starting 01/20 0830 for IR biopsy F/u resuming heparin infusion post - IR Check heparin level daily while on heparin Continue to monitor H&H and platelets    Thank you for allowing pharmacy to be a part of this patients care.  Ardyth Harps, PharmD Clinical Pharmacist  Follow-up long-term anticoagulation plan   Thank you for allowing pharmacy to be a part of this patients care.  Ardyth Harps, PharmD Clinical  Pharmacist

## 2021-02-13 NOTE — Progress Notes (Signed)
PROGRESS NOTE    Angie Rojas  YSA:[id number] DOB: 11-12-1945 DOA: 02/11/2021 PCP: Lajean Manes, MD   Chief Complaint  Patient presents with   Shortness of Breath   Brief Narrative/Hospital Course: Angie Rojas, 76 y.o. female with PMH of anxiety at baseline, recent left femoral vein DVT about a wk PTA and placed on Eliquis by PCP presented to ED with complaint of abdominal pain. Has had abdominal pain 3 to 4 weeks intermittent constant with radiation to the back 10 out of 10 in severity without improvement from pain medication.. She has had intermittent shortness of breath and chest pain  ED Course: Upon arrival to ED, she was tachycardic. cxr negative for any acute pathology.  CT angiogram of chest showed acute PE in segmental and subsegmental branches in the right lower lobe, dilatation of right ventricular cavity suggesting possible right ventricular strain.  No aortic dissection.  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  Borderline retroperitoneal lymph nodes.  Fibroid.  Patient was given IV pain medication and placed on heparin drip and admitted for further work-up.    Subjective: Seen and examined this morning.  Remains mildly anxious, family at the bedside complains of pain Overnight no fever blood pressure stable Labs decreasing leukocytosis hemoglobin in the 8 g range.  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  Continue pain control.  Discussed with Dr. [name] form hem-onc--will need to call oncology postbiopsy result.  Awaiting for IR for biopsy today.  CEA level elevated at 70.  Add hydrocodone for pain control.  Right lower lobe PE Recent left lower leg DVT a wk ago and was started on eliquis: ?Eliquis failure-possible  patient may have a DVT and PE at the same time, was on eliquis for DVT only for few days, notified hematology will await their recommendation for anticoagulations after biopsy.  Continue heparin infusion for now.   Leukocytosis likely in the setting of #1 no evidence of infection.  Monitor CBC  Anemia suspect from chronic disease, downtrending, monitor hemoglobin transfuse if less than 7 g Recent Labs  Lab 02/11/21 1324 02/12/21 0326 02/13/21 0143  HGB 9.2* 8.1* 8.3*  HCT 29.5* 27.0* 26.4*    Anxiety disorder initiated on Xanax tid prn.  Mild transaminitis likely from liver mets.  Monitor  DVT prophylaxis: Heparin gtt Code Status:   Code Status: Full Code Family Communication: plan of care discussed with patient and her son at bedside. Status is: Inpatient Remains inpatient appropriate because: Remains inpatient for ongoing management of PE and work-up of metastatic disease Disposition: Currently not medically stable for discharge. Anticipated Disposition: TBD  Total time spent in the care of this patient 35 MINUTES Objective: Vitals last 24 hrs: Vitals:   02/12/21 1649 02/12/21 2200 02/13/21 0445 02/13/21 0741  BP: 102/62 115/61 (!) 115/54 (!) 132/53  Pulse: (!) 103 (!) 104 90 (!) 102  Resp: 17 18 18 19   Temp: 100 F (37.8 C) 100.3 F (37.9 C) 97.9 F (36.6 C) 97.8 F (36.6 C)  TempSrc: Oral Oral Oral   SpO2: 95% 93% 94% (!) 82%  Weight:      Height:       Weight change:   Intake/Output Summary (Last 24 hours) at 02/13/2021 1036 Last data filed at 02/12/2021 1841 Gross per 24 hour  Intake 360 ml  Output --  Net 360 ml   Net IO Since Admission: 360 mL [02/13/21 1036]   Physical Examination: General exam: AAOx 3, anxious older than stated age, weak appearing. HEENT:Oral mucosa moist, Ear/Nose WNL grossly, dentition normal. Respiratory system: bilaterally clear, no use of accessory muscle Cardiovascular system: S1 & S2 +, No JVD,. Gastrointestinal system:  Abdomen soft, NT,ND, BS+ Nervous System:Alert, awake, moving extremities and grossly nonfocal Extremities: no edema, distal peripheral pulses palpable.  Skin: No rashes,no icterus. MSK: Normal muscle bulk,tone, power   Medications reviewed:  Scheduled Meds:  sodium chloride flush  3 mL Intravenous Q12H   Continuous Infusions:  sodium chloride 100 mL/hr at 02/12/21 2143   heparin Stopped (02/13/21 1914)   Diet Order             Diet regular Room service appropriate? Yes; Fluid consistency: Thin  Diet effective now                 Weight change:   Wt Readings from Last 3 Encounters:  02/11/21 54.4 kg  Consultants:see note  Procedures:see note Antimicrobials: Anti-infectives (From admission, onward)    None      Culture/Microbiology No results found for: SDES, SPECREQUEST, CULT, REPTSTATUS  Other culture-see note  Unresulted Labs (From admission, onward)     Start     Ordered   02/14/21 0500  APTT  Daily,   R      02/13/21 0105   02/13/21 0500  Heparin level (unfractionated)  Daily,   R      02/11/21 1705   02/12/21 0500  CBC  Daily,   R      02/11/21 1705          Data Reviewed: I have personally reviewed following labs and imaging studies CBC: Recent Labs  Lab 02/11/21 1324 02/12/21 0326 02/13/21 0143  WBC 19.4* 18.1* 15.7*  NEUTROABS 15.6*  --   --   HGB 9.2* 8.1* 8.3*  HCT 29.5* 27.0* 26.4*  MCV 89.1 92.8 89.8  PLT 542* 460* 782*   Basic Metabolic Panel: Recent Labs  Lab 02/11/21 1324 02/11/21 1728 02/12/21 0326 02/13/21 0143  NA 135  --  132* 137  K 3.9  --  3.7 4.1  CL 95*  --  103 104  CO2 23  --  22 26  GLUCOSE 116*  --  113* 121*  BUN 11  --  8 5*  CREATININE 0.55  --  0.52 0.54  CALCIUM 9.7  --  8.0* 8.6*  MG  --  2.0  --   --    GFR: Estimated Creatinine Clearance: 45 mL/min (by C-G formula based on SCr of 0.54 mg/dL). Liver Function Tests: Recent Labs  Lab 02/11/21 1324 02/12/21 0326  AST 61* 56*  ALT 59* 50*   ALKPHOS 413* 334*  BILITOT 0.9 0.6  PROT 6.8 5.6*  ALBUMIN 2.7* 2.2*   Recent Labs  Lab 02/11/21 1324  LIPASE 32   No results for input(s): AMMONIA in the last 168 hours. Coagulation Profile: No results for input(s): INR, PROTIME in the last 168 hours. Cardiac Enzymes: No results for input(s): CKTOTAL, CKMB, CKMBINDEX, TROPONINI in the last 168 hours. BNP (last 3 results) No results for input(s): PROBNP in the last 8760 hours. HbA1C: No results for input(s): HGBA1C in the last 72 hours. CBG: No results for input(s): GLUCAP in the last 168 hours. Lipid Profile: No results for input(s): CHOL, HDL, LDLCALC, TRIG, CHOLHDL, LDLDIRECT  in the last 72 hours. Thyroid Function Tests: Recent Labs    02/11/21 1728  TSH 2.716   Anemia Panel: No results for input(s): VITAMINB12, FOLATE, FERRITIN, TIBC, IRON, RETICCTPCT in the last 72 hours. Sepsis Labs: No results for input(s): PROCALCITON, LATICACIDVEN in the last 168 hours.  Recent Results (from the past 240 hour(s))  Resp Panel by RT-PCR (Flu A&B, Covid) Nasopharyngeal Swab     Status: None   Collection Time: 02/11/21  5:01 PM   Specimen: Nasopharyngeal Swab; Nasopharyngeal(NP) swabs in vial transport medium  Result Value Ref Range Status   SARS Coronavirus 2 by RT PCR NEGATIVE NEGATIVE Final    Comment: (NOTE) SARS-CoV-2 target nucleic acids are NOT DETECTED.  The SARS-CoV-2 RNA is generally detectable in upper respiratory specimens during the acute phase of infection. The lowest concentration of SARS-CoV-2 viral copies this assay can detect is 138 copies/mL. A negative result does not preclude SARS-Cov-2 infection and should not be used as the sole basis for treatment or other patient management decisions. A negative result may occur with  improper specimen collection/handling, submission of specimen other than nasopharyngeal swab, presence of viral mutation(s) within the areas targeted by this assay, and inadequate number  of viral copies(<138 copies/mL). A negative result must be combined with clinical observations, patient history, and epidemiological information. The expected result is Negative.  Fact Sheet for Patients:  EntrepreneurPulse.com.au  Fact Sheet for Healthcare Providers:  IncredibleEmployment.be  This test is no t yet approved or cleared by the [name] FDA and  has been authorized for detection and/or diagnosis of SARS-CoV-2 by FDA under an Emergency Use Authorization (EUA). This EUA will remain  in effect (meaning this test can be used) for the duration of the COVID-19 declaration under Section 564(b)(1) of the Act, 21 U.S.C.section 360bbb-3(b)(1), unless the authorization is terminated  or revoked sooner.       Influenza A by PCR NEGATIVE NEGATIVE Final   Influenza B by PCR NEGATIVE NEGATIVE Final    Comment: (NOTE) The Xpert Xpress SARS-CoV-2/FLU/RSV plus assay is intended as an aid in the diagnosis of influenza from Nasopharyngeal swab specimens and should not be used as a sole basis for treatment. Nasal washings and aspirates are unacceptable for Xpert Xpress SARS-CoV-2/FLU/RSV testing.  Fact Sheet for Patients: EntrepreneurPulse.com.au  Fact Sheet for Healthcare Providers: IncredibleEmployment.be  This test is not yet approved or cleared by the [name] FDA and has been authorized for detection and/or diagnosis of SARS-CoV-2 by FDA under an Emergency Use Authorization (EUA). This EUA will remain in effect (meaning this test can be used) for the duration of the COVID-19 declaration under Section 564(b)(1) of the Act, 21 U.S.C. section 360bbb-3(b)(1), unless the authorization is terminated or revoked.  Performed at [facility], [place] [address], [place], [place] [address]      Radiology Studies: DG Chest 2 View  Result Date: 02/11/2021 CLINICAL DATA:  Shortness of breath and  vomiting.  DVT. EXAM: CHEST - 2 VIEW COMPARISON:  None. FINDINGS: Trachea is midline. Heart size normal. Lungs are clear. No pleural fluid. IMPRESSION: No acute findings. Electronically Signed   By: Lorin Picket M.D.   On: 02/11/2021 13:50   CT Head Wo Contrast  Result Date: 02/11/2021 CLINICAL DATA:  Mental status change.  Rule out metastatic disease EXAM: CT HEAD WITHOUT CONTRAST TECHNIQUE: Contiguous axial images were obtained from the base of the skull through the vertex without intravenous contrast. RADIATION DOSE REDUCTION: This exam was performed according  to the departmental dose-optimization program which includes automated exposure control, adjustment of the mA and/or kV according to patient size and/or use of iterative reconstruction technique. COMPARISON:  MRI head 07/03/2013 FINDINGS: Brain: No evidence of acute infarction, hemorrhage, hydrocephalus, extra-axial collection or mass lesion/mass effect. Vascular: Negative for hyperdense vessel Skull: Negative Sinuses/Orbits: Paranasal sinuses clear.  Negative orbit Other: None IMPRESSION: Negative CT head Electronically Signed   By: Franchot Gallo M.D.   On: 02/11/2021 18:07   CT Angio Chest PE W and/or Wo Contrast  Result Date: 02/11/2021 CLINICAL DATA:  Shortness of breath, history of DVT in the left lower extremity EXAM: CT ANGIOGRAPHY CHEST WITH CONTRAST TECHNIQUE: Multidetector CT imaging of the chest was performed using the standard protocol during bolus administration of intravenous contrast. Multiplanar CT image reconstructions and MIPs were obtained to evaluate the vascular anatomy. RADIATION DOSE REDUCTION: This exam was performed according to the departmental dose-optimization program which includes automated exposure control, adjustment of the mA and/or kV according to patient size and/or use of iterative reconstruction technique. CONTRAST:  153mL OMNIPAQUE IOHEXOL 350 MG/ML SOLN COMPARISON:  None. FINDINGS: Cardiovascular: Contrast  density in the pulmonary artery branches is less than optimal. There is more contrast enhancement in the systemic vessels suggesting less than optimal timing of the imaging sequence. As far as seen, there are no filling defects in central pulmonary artery branches. There are few intraluminal filling defects in the segmental and subsegmental branches in the right lower lung fields with small thrombus burden. There is no ectasia of main pulmonary artery. Right ventricular cavity is more prominent than usual suggesting possible right ventricular strain. Midportion of right ventricular cavity measures 3.7 cm in diameter. Mediastinum/Nodes: No significant lymphadenopathy seen. Lungs/Pleura: There is no focal pulmonary consolidation. There is no pleural effusion or pneumothorax. Small linear densities in the lower lung fields suggest scarring or subsegmental atelectasis. Upper Abdomen: There are numerous space-occupying lesions of varying sizes in the visualized upper portions of liver measuring up to 5.3 cm in diameter. There is fatty infiltration in the liver. Musculoskeletal: Unremarkable. Review of the MIP images confirms the above findings. IMPRESSION: Acute pulmonary embolism in few segmental and subsegmental branches in the right lower lobe. Small thrombus burden. There is dilation of right ventricular cavity suggesting possible right ventricular strain. There is no evidence of thoracic aortic dissection. There is no focal pulmonary consolidation. There are numerous space-occupying lesions of varying sizes and varying attenuation in the liver suggesting extensive hepatic metastatic disease. Fatty infiltration is seen in the liver. Electronically Signed   By: Elmer Picker M.D.   On: 02/11/2021 16:02   CT ABDOMEN PELVIS W CONTRAST  Result Date: 02/11/2021 CLINICAL DATA:  Abdominal pain. EXAM: CT ABDOMEN AND PELVIS WITH CONTRAST TECHNIQUE: Multidetector CT imaging of the abdomen and pelvis was performed  using the standard protocol following bolus administration of intravenous contrast. RADIATION DOSE REDUCTION: This exam was performed according to the departmental dose-optimization program which includes automated exposure control, adjustment of the mA and/or kV according to patient size and/or use of iterative reconstruction technique. CONTRAST:  14mL OMNIPAQUE IOHEXOL 350 MG/ML SOLN COMPARISON:  None. FINDINGS: Lower chest: Free motion artifact but no pulmonary lesions or pleural effusions. 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 Largest lesion in the left hepatic lobe on image number 18/3 measures 4.4 cm. Index lesion in the right hepatic lobe on image 35/3 measures 4.1 cm. The gallbladder is grossly normal.  No common bile duct dilatation.  Pancreas: There is a 2.6 cm lesion in the pancreatic tail which could be the primary neoplasm. There is also a small lesion in the body tail junction region on image 32/3 which measures 10 mm. Spleen: Normal size.  No splenic lesions. Adrenals/Urinary Tract: No adrenal gland lesions. Small renal cysts. No worrisome renal lesions. The bladder is unremarkable. Stomach/Bowel: The stomach, duodenum, small bowel and terminal ileum are grossly normal. No acute inflammatory process, mass lesions or obstructive findings. No obvious colonic lesions to suggest a colonic primary. The appendix is normal. Vascular/Lymphatic: The aorta and branch vessels are patent. Scattered atherosclerotic calcifications. 2.2 cm the celiac axis lymph node on image 30/3. There are scattered borderline retroperitoneal lymph nodes without overt adenopathy. Nodular mesenteric implant on image 55/3 measures 2.1 cm. Mesenteric implant on image 57/3 measures 18 mm. Reproductive: Calcified right-sided fibroid. There is also a E 3.7 cm mass which could be a partially degenerated fibroid. Could not exclude the possibility of endometrial cancer. Pelvic ultrasound or MRI  pelvis may be helpful for further evaluation. No adnexal masses. Prominent gonadal veins bilaterally. Other: Small amount of free pelvic fluid is noted. No inguinal adenopathy. Musculoskeletal: No significant bony findings. IMPRESSION: 1. Diffuse hepatic metastatic disease. 2. 2.6 cm lesion in the pancreatic tail could be the primary neoplasm. However there is a second lesion in the pancreatic body/tail junction region and this could reflect metastatic disease also. 3. Celiac axis lymphadenopathy and mesenteric implants. 4. Borderline retroperitoneal lymph nodes. 5. 3.7 cm mass in the uterus could be a partially degenerated fibroid. Could not exclude the possibility of any medial cancer. PET-CT may be helpful to evaluate all of the above findings and to potentially guide a safe and appropriate biopsy. Electronically Signed   By: Marijo Sanes M.D.   On: 02/11/2021 16:07   ECHOCARDIOGRAM COMPLETE  Result Date: 02/12/2021    ECHOCARDIOGRAM REPORT   Patient Name:   Angie Rojas Date of Exam: 02/12/2021 Medical Rec #:  [id number]         Height:       60.0 in Accession #:    [id number]        Weight:       120.0 lb Date of Birth:  1945/06/04         BSA:          1.502 m Patient Age:    11 years          BP:           103/59 mmHg Patient Gender: F                 HR:           97 bpm. Exam Location:  Inpatient Procedure: 2D Echo Indications:    Pulmonary embolism  History:        Patient has no prior history of Echocardiogram examinations.  Sonographer:    Arlyss Gandy Referring Phys: [id number] RAVI PAHWANI IMPRESSIONS  1. Left ventricular ejection fraction, by estimation, is 60 to 65%. The left ventricle has normal function. The left ventricle has no regional wall motion abnormalities. Left ventricular diastolic parameters were normal.  2. Right ventricular systolic function is normal. The right ventricular size is normal. There is normal pulmonary artery systolic pressure.  3. The mitral valve is normal in  structure. Trivial mitral valve regurgitation. No evidence of mitral stenosis.  4. The aortic valve is tricuspid. Aortic valve regurgitation is not visualized. No aortic stenosis is  present.  5. The inferior vena cava is normal in size with greater than 50% respiratory variability, suggesting right atrial pressure of 3 mmHg. Comparison(s): No prior Echocardiogram. FINDINGS  Left Ventricle: Left ventricular ejection fraction, by estimation, is 60 to 65%. The left ventricle has normal function. The left ventricle has no regional wall motion abnormalities. The left ventricular internal cavity size was normal in size. There is  no left ventricular hypertrophy. Left ventricular diastolic parameters were normal. Right Ventricle: The right ventricular size is normal. Right ventricular systolic function is normal. There is normal pulmonary artery systolic pressure. The tricuspid regurgitant velocity is 2.51 m/s, and with an assumed right atrial pressure of 3 mmHg,  the estimated right ventricular systolic pressure is 64.3 mmHg. Left Atrium: Left atrial size was normal in size. Right Atrium: Right atrial size was normal in size. Pericardium: There is no evidence of pericardial effusion. Mitral Valve: The mitral valve is normal in structure. Trivial mitral valve regurgitation. No evidence of mitral valve stenosis. Tricuspid Valve: The tricuspid valve is normal in structure. Tricuspid valve regurgitation is mild . No evidence of tricuspid stenosis. Aortic Valve: The aortic valve is tricuspid. Aortic valve regurgitation is not visualized. No aortic stenosis is present. Aortic valve mean gradient measures 7.0 mmHg. Aortic valve peak gradient measures 13.8 mmHg. Pulmonic Valve: The pulmonic valve was normal in structure. Pulmonic valve regurgitation is not visualized. No evidence of pulmonic stenosis. Aorta: The aortic root is normal in size and structure. Venous: The inferior vena cava is normal in size with greater than 50%  respiratory variability, suggesting right atrial pressure of 3 mmHg. IAS/Shunts: No atrial level shunt detected by color flow Doppler.  LEFT VENTRICLE PLAX 2D LVIDd:         3.80 cm   Diastology LVIDs:         2.70 cm   LV e' medial:    11.00 cm/s LV PW:         0.80 cm   LV E/e' medial:  9.6 LV IVS:        0.80 cm   LV e' lateral:   11.00 cm/s LVOT diam:     1.80 cm   LV E/e' lateral: 9.6 LV SV:         69 LV SV Index:   46 LVOT Area:     2.54 cm  RIGHT VENTRICLE             IVC RV Basal diam:  3.20 cm     IVC diam: 1.50 cm RV Mid diam:    2.50 cm RV S prime:     15.40 cm/s TAPSE (M-mode): 2.1 cm LEFT ATRIUM             Index        RIGHT ATRIUM           Index LA diam:        2.90 cm 1.93 cm/m   RA Area:     11.40 cm LA Vol (A2C):   30.4 ml 20.24 ml/m  RA Volume:   23.70 ml  15.78 ml/m LA Vol (A4C):   50.8 ml 33.81 ml/m LA Biplane Vol: 40.8 ml 27.16 ml/m  AORTIC VALVE AV Area (Vmax):    1.67 cm AV Area (Vmean):   1.78 cm AV Area (VTI):     1.89 cm AV Vmax:           186.00 cm/s AV Vmean:  120.000 cm/s AV VTI:            0.363 m AV Peak Grad:      13.8 mmHg AV Mean Grad:      7.0 mmHg LVOT Vmax:         122.00 cm/s LVOT Vmean:        83.900 cm/s LVOT VTI:          0.270 m LVOT/AV VTI ratio: 0.74  AORTA Ao Root diam: 2.60 cm Ao Asc diam:  2.60 cm MITRAL VALVE                TRICUSPID VALVE MV Area (PHT): 2.79 cm     TR Peak grad:   25.2 mmHg MV Decel Time: 272 msec     TR Vmax:        251.00 cm/s MV E velocity: 106.00 cm/s MV A velocity: 94.70 cm/s   SHUNTS MV E/A ratio:  1.12         Systemic VTI:  0.27 m                             Systemic Diam: 1.80 cm Kirk Ruths MD Electronically signed by Kirk Ruths MD Signature Date/Time: 02/12/2021/11:40:05 AM    Final      LOS: 2 days   Antonieta Pert, MD [facility]  02/13/2021, 10:36 AM

## 2021-02-13 NOTE — Progress Notes (Signed)
ANTICOAGULATION CONSULT NOTE  Pharmacy Consult for IV heparin Indication: pulmonary embolus  No Known Allergies  Patient Measurements: Height: 5' (152.4 cm) Weight: 54.4 kg (120 lb) IBW/kg (Calculated) : 45.5 Heparin Dosing Weight: 54.4kg  Vital Signs: Temp: 97.8 F (36.6 C) (01/20 0741) Temp Source: Oral (01/20 0445) BP: 109/61 (01/20 1435) Pulse Rate: 91 (01/20 1435)  Labs: Recent Labs    02/11/21 1324 02/11/21 1628 02/12/21 0325 02/12/21 0326 02/12/21 1518 02/13/21 0143  HGB 9.2*  --   --  8.1*  --  8.3*  HCT 29.5*  --   --  27.0*  --  26.4*  PLT 542*  --   --  460*  --  424*  APTT  --   --  37*  --  51* 85*  HEPARINUNFRC  --   --  >1.10*  --   --  0.66  CREATININE 0.55  --   --  0.52  --  0.54  TROPONINIHS 8 8  --   --   --   --      Estimated Creatinine Clearance: 45 mL/min (by C-G formula based on SCr of 0.54 mg/dL).   Assessment: 42 YOF presenting with increased shortness of breath to the ED. PMH significant for newly diagnosed DVT in left femoral posterior tibial and peroneal veins on 02/03/2021. Pt was started on Eliquis 5 mg BID, last dose taken at 0900 02/11/21. CT angio positive for acute PE in right lower lobe with possible right ventricular strain. Pharmacy to dose IV heparin.  She is now s/p biopsy and heparin to restart at 5:30pm   Goal of Therapy:  Heparin level 0.3-0.7 units/ml Monitor platelets by anticoagulation protocol: Yes   Plan:  Restart IV heparin 1250 units/hr at 5:30pm 8h heparin level and aPTT Daily heparin level, CBC Monitor for signs/symptoms of bleeding Follow-up long-term anticoagulation plan  Thank you for involving pharmacy in this patient's care.  Hildred Laser, PharmD Clinical Pharmacist **Pharmacist phone directory can now be found on [email or web address] ([email or web address]).  Listed under [place].

## 2021-02-14 DIAGNOSIS — J90 Pleural effusion, not elsewhere classified: Secondary | ICD-10-CM

## 2021-02-14 DIAGNOSIS — Z79899 Other long term (current) drug therapy: Secondary | ICD-10-CM

## 2021-02-14 DIAGNOSIS — D649 Anemia, unspecified: Secondary | ICD-10-CM

## 2021-02-14 DIAGNOSIS — C799 Secondary malignant neoplasm of unspecified site: Secondary | ICD-10-CM | POA: Diagnosis not present

## 2021-02-14 DIAGNOSIS — Z7901 Long term (current) use of anticoagulants: Secondary | ICD-10-CM

## 2021-02-14 DIAGNOSIS — K7689 Other specified diseases of liver: Secondary | ICD-10-CM

## 2021-02-14 DIAGNOSIS — K869 Disease of pancreas, unspecified: Secondary | ICD-10-CM

## 2021-02-14 DIAGNOSIS — D72829 Elevated white blood cell count, unspecified: Secondary | ICD-10-CM

## 2021-02-14 DIAGNOSIS — I82402 Acute embolism and thrombosis of unspecified deep veins of left lower extremity: Secondary | ICD-10-CM

## 2021-02-14 LAB — HEPARIN LEVEL (UNFRACTIONATED)
Heparin Unfractionated: 0.23 IU/mL — ABNORMAL LOW (ref 0.30–0.70)
Heparin Unfractionated: 0.24 IU/mL — ABNORMAL LOW (ref 0.30–0.70)

## 2021-02-14 LAB — CBC
HCT: 24.5 % — ABNORMAL LOW (ref 36.0–46.0)
Hemoglobin: 7.8 g/dL — ABNORMAL LOW (ref 12.0–15.0)
MCH: 28.6 pg (ref 26.0–34.0)
MCHC: 31.8 g/dL (ref 30.0–36.0)
MCV: 89.7 fL (ref 80.0–100.0)
Platelets: 371 K/uL (ref 150–400)
RBC: 2.73 MIL/uL — ABNORMAL LOW (ref 3.87–5.11)
RDW: 13.6 % (ref 11.5–15.5)
WBC: 17.4 K/uL — ABNORMAL HIGH (ref 4.0–10.5)
nRBC: 0 % (ref 0.0–0.2)

## 2021-02-14 LAB — APTT: aPTT: 62 seconds — ABNORMAL HIGH (ref 24–36)

## 2021-02-14 MED ORDER — ENSURE ENLIVE PO LIQD
237.0000 mL | Freq: Two times a day (BID) | ORAL | Status: DC
  Administered 2021-02-14 – 2021-02-16 (×4): 237 mL via ORAL

## 2021-02-14 MED ORDER — DOCUSATE SODIUM 100 MG PO CAPS
100.0000 mg | ORAL_CAPSULE | Freq: Two times a day (BID) | ORAL | Status: DC
  Administered 2021-02-14 – 2021-02-18 (×9): 100 mg via ORAL
  Filled 2021-02-14 (×9): qty 1

## 2021-02-14 MED ORDER — ADULT MULTIVITAMIN W/MINERALS CH
1.0000 | ORAL_TABLET | Freq: Every day | ORAL | Status: DC
  Administered 2021-02-14 – 2021-02-18 (×5): 1 via ORAL
  Filled 2021-02-14 (×5): qty 1

## 2021-02-14 MED ORDER — METHOCARBAMOL 1000 MG/10ML IJ SOLN
500.0000 mg | Freq: Once | INTRAVENOUS | Status: AC
  Administered 2021-02-14: 500 mg via INTRAVENOUS
  Filled 2021-02-14: qty 500

## 2021-02-14 MED ORDER — LIDOCAINE 5 % EX PTCH
1.0000 | MEDICATED_PATCH | CUTANEOUS | Status: AC
  Administered 2021-02-14: 1 via TRANSDERMAL
  Filled 2021-02-14: qty 1

## 2021-02-14 MED ORDER — OXYCODONE-ACETAMINOPHEN 7.5-325 MG PO TABS
1.0000 | ORAL_TABLET | ORAL | Status: DC | PRN
  Administered 2021-02-14 – 2021-02-15 (×5): 1 via ORAL
  Filled 2021-02-14 (×5): qty 1

## 2021-02-14 NOTE — Progress Notes (Signed)
ANTICOAGULATION CONSULT NOTE - Follow Up Consult  Pharmacy Consult for heparin Indication:  PE/DVT  Labs: Recent Labs    02/11/21 1324 02/11/21 1324 02/11/21 1628 02/12/21 0325 02/12/21 0326 02/12/21 1518 02/13/21 0143 02/14/21 0340  HGB 9.2*  --   --   --  8.1*  --  8.3* 7.8*  HCT 29.5*  --   --   --  27.0*  --  26.4* 24.5*  PLT 542*  --   --   --  460*  --  424* 371  APTT  --    < >  --  37*  --  51* 85* 62*  HEPARINUNFRC  --   --   --  >1.10*  --   --  0.66 0.23*  CREATININE 0.55  --   --   --  0.52  --  0.54  --   TROPONINIHS 8  --  8  --   --   --   --   --    < > = values in this interval not displayed.    Assessment: 76yo female subtherapeutic on heparin after resuming for PE/DVT; no infusion issues or signs of bleeding per RN.  PTT appears to be correlating with heparin level.  Goal of Therapy:  Heparin level 0.3-0.7 units/ml   Plan:  Will increase heparin infusion by 2-3 units/kg/hr to 1400 units/hr and check level in 8 hours.    Wynona Neat, PharmD, BCPS  02/14/2021,5:59 AM

## 2021-02-14 NOTE — Progress Notes (Signed)
PROGRESS NOTE    Angie Rojas  MWN:[id number] DOB: Nov 01, 1945 DOA: 02/11/2021 PCP: Lajean Manes, MD   Chief Complaint  Patient presents with   Shortness of Breath   Brief Narrative/Hospital Course: [place], 76 y.o. female with PMH of anxiety at baseline, recent left femoral vein DVT about a wk PTA and placed on Eliquis by PCP presented to ED with complaint of abdominal pain. Has had abdominal pain 3 to 4 weeks intermittent constant with radiation to the back 10 out of 10 in severity without improvement from pain medication.. She has had intermittent shortness of breath and chest pain  ED Course: Upon arrival to ED, she was tachycardic. cxr negative for any acute pathology.  CT angiogram of chest showed acute PE in segmental and subsegmental branches in the right lower lobe, dilatation of right ventricular cavity suggesting possible right ventricular strain.  No aortic dissection.  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  Borderline retroperitoneal lymph nodes.  Fibroid.  Patient was given IV pain medication and placed on heparin drip and admitted for further work-up.    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  Change to po percocet and add colace.  Discussed with Dr. [name] form hem-onc--will need to call oncology postbiopsy result.  CEA level elevated at 70.    Right lower lobe PE Recent left lower leg DVT a wk ago and was started on eliquis: Not eloquis faillure, resume. Stop hep drip  Leukocytosis likely in the setting of #1 no evidence of infection.  Monitor CBC  Anemia suspect from chronic disease, downtrending, monitor hemoglobin transfuse  if less than 7 g Recent Labs  Lab 02/11/21 1324 02/12/21 0326 02/13/21 0143 02/14/21 0340  HGB 9.2* 8.1* 8.3* 7.8*  HCT 29.5* 27.0* 26.4* 24.5*    Anxiety disorder initiated on Xanax tid prn.  Mild transaminitis likely from liver mets.  Monitor  DVT prophylaxis: Heparin gtt Code Status:   Code Status: Full Code Family Communication: plan of care discussed with patient and her son at bedside. Status is: Inpatient Remains inpatient appropriate because: Remains inpatient for ongoing management of PE and work-up of metastatic disease Disposition: when pain better controlled, oncology final recs, palliative care consult pending  Total time spent in the care of this patient 35 MINUTES Objective: Vitals last 24 hrs: Vitals:   02/13/21 1945 02/14/21 0453 02/14/21 0500 02/14/21 0851  BP: (!) 97/48 (!) 101/49  131/65  Pulse: 73 88  (!) 104  Resp: 17 16  18   Temp: 98.3 F (36.8 C) 98.3 F (36.8 C)  98.7 F (37.1 C)  TempSrc:    Oral  SpO2: (!) 89% 95%  94%  Weight:   55.6 kg   Height:       Weight change:   Intake/Output Summary (Last 24 hours) at 02/14/2021 1150 Last data filed at 02/13/2021 1500 Gross per 24 hour  Intake 240 ml  Output --  Net 240 ml   Net IO Since Admission: 600 mL [02/14/21 1150]   Physical Examination: General exam: AAOx 3, anxious older than stated age, weak appearing. HEENT:Oral mucosa moist, Ear/Nose WNL grossly, dentition normal. Respiratory system: bilaterally clear, no use of accessory muscle Cardiovascular system: S1 &  S2 +, No JVD,. Gastrointestinal system: Abdomen soft, NT,ND, BS+ Nervous System:Alert, awake, moving extremities and grossly nonfocal Extremities: no edema, distal peripheral pulses palpable.  Skin: No rashes,no icterus. MSK: Normal muscle bulk,tone, power   Medications reviewed:  Scheduled Meds:  docusate sodium  100 mg Oral BID   sodium chloride flush  3 mL Intravenous Q12H   Continuous Infusions:  sodium chloride 100  mL/hr at 02/14/21 0544   heparin 1,400 Units/hr (02/14/21 0602)   Diet Order             Diet regular Room service appropriate? Yes; Fluid consistency: Thin  Diet effective now                 Weight change:   Wt Readings from Last 3 Encounters:  02/14/21 55.6 kg  Consultants:see note  Procedures:see note Antimicrobials: Anti-infectives (From admission, onward)    None      Culture/Microbiology No results found for: SDES, SPECREQUEST, CULT, REPTSTATUS  Other culture-see note  Unresulted Labs (From admission, onward)     Start     Ordered   02/15/21 0500  Heparin level (unfractionated)  Daily,   R      02/13/21 1456   02/14/21 1400  Heparin level (unfractionated)  Once-Timed,   TIMED        02/14/21 0600   02/12/21 0500  CBC  Daily,   R      02/11/21 1705          Data Reviewed: I have personally reviewed following labs and imaging studies CBC: Recent Labs  Lab 02/11/21 1324 02/12/21 0326 02/13/21 0143 02/14/21 0340  WBC 19.4* 18.1* 15.7* 17.4*  NEUTROABS 15.6*  --   --   --   HGB 9.2* 8.1* 8.3* 7.8*  HCT 29.5* 27.0* 26.4* 24.5*  MCV 89.1 92.8 89.8 89.7  PLT 542* 460* 424* 196   Basic Metabolic Panel: Recent Labs  Lab 02/11/21 1324 02/11/21 1728 02/12/21 0326 02/13/21 0143  NA 135  --  132* 137  K 3.9  --  3.7 4.1  CL 95*  --  103 104  CO2 23  --  22 26  GLUCOSE 116*  --  113* 121*  BUN 11  --  8 5*  CREATININE 0.55  --  0.52 0.54  CALCIUM 9.7  --  8.0* 8.6*  MG  --  2.0  --   --    GFR: Estimated Creatinine Clearance: 48.9 mL/min (by C-G formula based on SCr of 0.54 mg/dL). Liver Function Tests: Recent Labs  Lab 02/11/21 1324 02/12/21 0326  AST 61* 56*  ALT 59* 50*  ALKPHOS 413* 334*  BILITOT 0.9 0.6  PROT 6.8 5.6*  ALBUMIN 2.7* 2.2*   Recent Labs  Lab 02/11/21 1324  LIPASE 32   No results for input(s): AMMONIA in the last 168 hours. Coagulation Profile: No results for input(s): INR, PROTIME in the last 168  hours. Cardiac Enzymes: No results for input(s): CKTOTAL, CKMB, CKMBINDEX, TROPONINI in the last 168 hours. BNP (last 3 results) No results for input(s): PROBNP in the last 8760 hours. HbA1C: No results for input(s): HGBA1C in the last 72 hours. CBG: No results for input(s): GLUCAP in the last 168 hours. Lipid Profile: No results for input(s): CHOL, HDL, LDLCALC, TRIG, CHOLHDL, LDLDIRECT in the last 72 hours. Thyroid Function Tests: Recent Labs    02/11/21 1728  TSH 2.716   Anemia Panel: No results for input(s): VITAMINB12, FOLATE, FERRITIN, TIBC, IRON, RETICCTPCT  in the last 72 hours. Sepsis Labs: No results for input(s): PROCALCITON, LATICACIDVEN in the last 168 hours.  Recent Results (from the past 240 hour(s))  Resp Panel by RT-PCR (Flu A&B, Covid) Nasopharyngeal Swab     Status: None   Collection Time: 02/11/21  5:01 PM   Specimen: Nasopharyngeal Swab; Nasopharyngeal(NP) swabs in vial transport medium  Result Value Ref Range Status   SARS Coronavirus 2 by RT PCR NEGATIVE NEGATIVE Final    Comment: (NOTE) SARS-CoV-2 target nucleic acids are NOT DETECTED.  The SARS-CoV-2 RNA is generally detectable in upper respiratory specimens during the acute phase of infection. The lowest concentration of SARS-CoV-2 viral copies this assay can detect is 138 copies/mL. A negative result does not preclude SARS-Cov-2 infection and should not be used as the sole basis for treatment or other patient management decisions. A negative result may occur with  improper specimen collection/handling, submission of specimen other than nasopharyngeal swab, presence of viral mutation(s) within the areas targeted by this assay, and inadequate number of viral copies(<138 copies/mL). A negative result must be combined with clinical observations, patient history, and epidemiological information. The expected result is Negative.  Fact Sheet for Patients:   EntrepreneurPulse.com.au  Fact Sheet for Healthcare Providers:  IncredibleEmployment.be  This test is no t yet approved or cleared by the [name] FDA and  has been authorized for detection and/or diagnosis of SARS-CoV-2 by FDA under an Emergency Use Authorization (EUA). This EUA will remain  in effect (meaning this test can be used) for the duration of the COVID-19 declaration under Section 564(b)(1) of the Act, 21 U.S.C.section 360bbb-3(b)(1), unless the authorization is terminated  or revoked sooner.       Influenza A by PCR NEGATIVE NEGATIVE Final   Influenza B by PCR NEGATIVE NEGATIVE Final    Comment: (NOTE) The Xpert Xpress SARS-CoV-2/FLU/RSV plus assay is intended as an aid in the diagnosis of influenza from Nasopharyngeal swab specimens and should not be used as a sole basis for treatment. Nasal washings and aspirates are unacceptable for Xpert Xpress SARS-CoV-2/FLU/RSV testing.  Fact Sheet for Patients: EntrepreneurPulse.com.au  Fact Sheet for Healthcare Providers: IncredibleEmployment.be  This test is not yet approved or cleared by the [name] FDA and has been authorized for detection and/or diagnosis of SARS-CoV-2 by FDA under an Emergency Use Authorization (EUA). This EUA will remain in effect (meaning this test can be used) for the duration of the COVID-19 declaration under Section 564(b)(1) of the Act, 21 U.S.C. section 360bbb-3(b)(1), unless the authorization is terminated or revoked.  Performed at [facility], [place] [address] [place], [place] [address]      Radiology Studies: US BIOPSY (LIVER)  Result Date: 02/13/2021 INDICATION: Liver metastases, suspect pancreas primary by imaging. EXAM: ULTRASOUND CORE BIOPSY RIGHT LIVER METASTASIS MEDICATIONS: 1% LIDOCAINE LOCAL ANESTHESIA/SEDATION: Moderate (conscious) sedation was employed during this procedure. A total of  Versed 1.0 mg and Fentanyl 25 mcg was administered intravenously by the radiology nurse. Total intra-service moderate Sedation Time: 7 minutes. The patient's level of consciousness and vital signs were monitored continuously by radiology nursing throughout the procedure under my direct supervision. FLUOROSCOPY TIME:  Fluoroscopy Time: None. COMPLICATIONS: None immediate. PROCEDURE: Informed written consent was obtained from the patient after a thorough discussion of the procedural risks, benefits and alternatives. All questions were addressed. Maximal Sterile Barrier Technique was utilized including caps, mask, sterile gowns, sterile gloves, sterile drape, hand hygiene and skin antiseptic. A timeout was performed prior to the initiation  of the procedure. Previous imaging reviewed. Preliminary ultrasound performed. A right hepatic lesion was localized in the mid axillary line through a lower intercostal space. Overlying skin marked. Under sterile conditions and local anesthesia, a 17 gauge coaxial guide needle was advanced into the lesion. Needle position confirmed with ultrasound. 18 gauge core biopsies obtained. Samples placed in formalin. Needle tract occluded with Gel-Foam. Postprocedure imaging demonstrates no hemorrhage or hematoma. Patient tolerated biopsy well. IMPRESSION: Successful ultrasound core biopsy of the right liver metastasis. Electronically Signed   By: Jerilynn Mages.  Shick M.D.   On: 02/13/2021 15:10     LOS: 3 days   Zadiel Leyh A, MD [facility]  02/14/2021, 11:50 AM   Patient ID: Angie Rojas, female   DOB: [birth year], 76 y.o.   MRN: [id number]

## 2021-02-14 NOTE — Consult Note (Signed)
Palliative Medicine Inpatient Consult Note  Consulting Provider: Maryanna Shape, NP  Reason for consult:   Palliative Care Consult Services Symptom Management Consult  Reason for Consult? Patient has likely diagnosis of metastatic pancreatic cancer-biopsy pending.  Need assistance with pain control.    HPI:  Per intake H&P --> Angie Rojas, 76 y.o. female with PMH of anxiety at baseline, recent left femoral vein DVT about a wk PTA and placed on Eliquis by PCP presented to ED with complaint of abdominal pain.  Palliative care has been asked to get involved to aid in goals of care conversations and symptom management.   Clinical Assessment/Goals of Care:  *Please note that this is a verbal dictation therefore any spelling or grammatical errors are due to the "[place]" system interpretation.  I have reviewed medical records including EPIC notes, labs and imaging, received report from bedside RN, assessed the patient.    I met with Angie Rojas, her sons Angie Rojas and daughters in law Angie Rojas and Angie Rojas to further discuss diagnosis prognosis, GOC, EOL wishes, disposition and options.   I introduced Palliative Medicine as specialized medical care for people living with serious illness. It focuses on providing relief from the symptoms and stress of a serious illness. The goal is to improve quality of life for both the patient and the family.  Medical History Review and Understanding:  Abdominal pain x4 weeks. Recent DVT.   Newly diagnosed with hepatic metastatic disease with pancreatic tail and body lesion. Biopsy is pending presently.   Social History:  Angie Rojas lives in [place]. She is widowed as her husband passed away about a year and a half ago. She has two son's one who is a PA and lives in [place]. 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 Patient is faithful and practices within the [facility] denomination.   Functional and Nutritional  State:  Prior to admission, Angie Rojas was able to perform all BADL's and IADL's independently.   Palliative Symptoms:  Metastatic Pain - Presently receiving Percocet PO Q6H PRN.   Anxiety - Initiated on Xanax 0.62m PO TID PRN. Was prior prescribed zoloft 220mQday which we discussed starting in house  Failure to thrive - PO's have been good in the hospital though has suffered a recent 10lb weight loss. Not presently needing an appetite stimulant per our conversation though   Mobility - Leans back while walking - refused to utilize a walker. PT/OT  Fatigue - Incremental in occurrence in the setting of disease.  Constipation - Has been notable since hospitalization.  Advance Directives: A detailed discussion was had today regarding advanced directives.    Code Status: Full Code/Full Scope for the present time.   Goals for the Future:  Remain present and functional for as long as possible.Patient would like to discuss additional goals in terms of what to go in the future with her family.   We reviewed the importance of ongoing conversations and consideration of the risk vs benefit of each treatment from here on out.  Discussed the importance of continued conversation with family and their  medical providers regarding overall plan of care and treatment options, ensuring decisions are within the context of the patients values and GOCs.  Provided "Hard Choices for Angie Rojas.   Decision Maker: Patient can make decisions for herself  SUMMARY OF RECOMMENDATIONS   Full Code  FU final pathology results  Referral to [facility]  [facility] during this difficult time  Code Status/Advance Care Planning: FULL CODE   Symptom Management:  Metastatic Pain - - DC Percocet - Oxycodone 7.5-15mg PO Q6H PRN moderate-severe pain - Low dose long acting OxyContin 63m PO BID - Dilaudid 0/5-198mIV Q3H PRN - Decadron 76m59mO TID for  three day course to help capsular pain  Anxiety -  - Sertraline 6m476may - Initiated on Xanax 0.6mg30mTID PRN  Failure to thrive -  - Nutrition consult - PO's have been good in the hospital  Mobility -  - PT/OT consults  Constipation: - Miralax 17g BID  Nausea: - Zofran Q6H PRN  Fatigue -  -Pace yourself -Plan your day -Include naps and breaks -schedule a relaxing day -get a little exercise -fuel the body -consider complementary therapies -deep breathing -prayer/medication  -guided meditation  Palliative Prophylaxis:  Aspiration, Bowel Regimen, Delirium Protocol, Frequent Pain Assessment, Oral Care, Palliative Wound Care, and Turn Reposition  Additional Recommendations (Limitations, Scope, Preferences): Continue current treatment  Psycho-social/Spiritual:  Desire for further Chaplaincy support: Yes Additional Recommendations: Education on pancreatic cancer   Prognosis: Unclear - awaiting Biopsy though worrisome given identified pancreatic mass(s)  Discharge Planning:   Vitals:   02/14/21 0453 02/14/21 0851  BP: (!) 101/49 131/65  Pulse: 88 (!) 104  Resp: 16 18  Temp: 98.3 F (36.8 C) 98.7 F (37.1 C)  SpO2: 95% 94%    Intake/Output Summary (Last 24 hours) at 02/14/2021 1625 Last data filed at 02/14/2021 1530 Gross per 24 hour  Intake 276.53 ml  Output --  Net 276.53 ml   Last Weight  Most recent update: 02/14/2021  6:05 AM    Weight  55.6 kg (122 lb 9.2 oz)            Gen:  NAD HEENT: moist mucous membranes CV: Regular rate and rhythm PULM: clear to auscultation bilaterally ABD: soft/nontender EXT: No edema Neuro: Alert and oriented x3  PPS: 40%   This conversation/these recommendations were discussed with patient primary care team, Dr. DavidShanon Browal Time: 138  MDM - High  Medical Decision Making: 4 #/Complex Problems: 4                   Data Reviewed:  4               Management: 4 (1-Straightforward, 2-Low, 3-Moderate,  4-High) ______________________________________________________ [facility] Team Team Cell Phone: [phone] utilize secure chat with additional questions, if there is no response within 30 minutes please call the above phone number  Palliative Medicine Team providers are available by phone from 7am to 7pm daily and can be reached through the team cell phone.  Should this patient require assistance outside of these hours, please call the patient's attending physician.

## 2021-02-14 NOTE — Progress Notes (Signed)
ANTICOAGULATION CONSULT NOTE  Pharmacy Consult for IV heparin Indication: pulmonary embolus  No Known Allergies  Patient Measurements: Height: 5' (152.4 cm) Weight: 55.6 kg (122 lb 9.2 oz) IBW/kg (Calculated) : 45.5 Heparin Dosing Weight: 54.4kg  Vital Signs: Temp: 98.7 F (37.1 C) (01/21 0851) Temp Source: Oral (01/21 0851) BP: 131/65 (01/21 0851) Pulse Rate: 104 (01/21 0851)  Labs: Recent Labs    02/11/21 1628 02/12/21 0325 02/12/21 0326 02/12/21 1518 02/13/21 0143 02/14/21 0340 02/14/21 1432  HGB  --    < > 8.1*  --  8.3* 7.8*  --   HCT  --   --  27.0*  --  26.4* 24.5*  --   PLT  --   --  460*  --  424* 371  --   APTT  --    < >  --  51* 85* 62*  --   HEPARINUNFRC  --    < >  --   --  0.66 0.23* 0.24*  CREATININE  --   --  0.52  --  0.54  --   --   TROPONINIHS 8  --   --   --   --   --   --    < > = values in this interval not displayed.     Estimated Creatinine Clearance: 48.9 mL/min (by C-G formula based on SCr of 0.54 mg/dL).   Assessment: Angie Rojas presenting with increased shortness of breath to the ED. PMH significant for newly diagnosed DVT in left femoral posterior tibial and peroneal veins on 02/03/2021. Pt was started on Eliquis 5 mg BID, last dose taken at 0900 02/11/21. CT angio positive for acute PE in right lower lobe with possible right ventricular strain. Pharmacy to dose IV heparin.  Heparin level subtherapeutic at 0.24, per RN no issues during heparin infusion. No signs or symptoms of bleeding.   Goal of Therapy:  Heparin level 0.3-0.7 units/ml Monitor platelets by anticoagulation protocol: Yes   Plan:  Increase IV heparin to 1500 units/h 8h heparin level Daily heparin level, CBC Monitor for signs/symptoms of bleeding Follow-up long-term anticoagulation plan  Thank you for involving pharmacy in this patient's care.  Lestine Box, PharmD PGY2 Infectious Diseases Pharmacy Resident   Please check [email or web address] for unit-specific pharmacy  phone numbers

## 2021-02-14 NOTE — Progress Notes (Signed)
Initial Nutrition Assessment  DOCUMENTATION CODES:   Not applicable  INTERVENTION:   Ensure Enlive po BID, each supplement provides 350 kcal and 20 grams of protein  MVI with Minerals  Continue Regular diet  NUTRITION DIAGNOSIS:   Inadequate oral intake related to nausea, poor appetite, vomiting, cancer and cancer related treatments (abdominal pain) as evidenced by per patient/family report.  GOAL:   Patient will meet greater than or equal to 90% of their needs  MONITOR:   PO intake, Supplement acceptance, Labs, Weight trends  REASON FOR ASSESSMENT:   Consult Assessment of nutrition requirement/status  ASSESSMENT:   76 yo female admitted with abdominal pain with imaging concerning for pancreatic mass and liver lesions concerning for metastatic malignancy, CA 19-9 significantly elevated.  11/20 Liver biopsy-result pending  Pt seen by medical oncology yesterday; per MD, treatment options would be palliative systemic treatment vs supportive care. Disease is not resectable. Noted palliative care has been consult to assist in pain med management   Pt has been experiencing abdominal pain for several weeks, reflux symptoms for several months, intermittent N/V with decreased appetite and weight loss.   Per report, pt does not weigh herself so unsure how much wt she has lost. Current wt 55.6 kg  Pt ate 100% of one meal yesterday, no other recorded po intake. 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   Labs: reviewed Meds: NS at 100 ml/hr, colace, miralax  NUTRITION - FOCUSED PHYSICAL EXAM:  Unable to assess  Diet Order:   Diet Order             Diet regular Room service appropriate? Yes; Fluid consistency: Thin  Diet effective now                   EDUCATION NEEDS:   Not appropriate for education at this time  Skin:  Skin Assessment: Skin Integrity  Issues: Skin Integrity Issues:: Incisions Incisions: puncture to abdomen-liver biopsy  Last BM:  1/18  Height:   Ht Readings from Last 1 Encounters:  02/11/21 5' (1.524 m)    Weight:   Wt Readings from Last 1 Encounters:  02/14/21 55.6 kg    BMI:  Body mass index is 23.94 kg/m.  Estimated Nutritional Needs:   Kcal:  1950-2150 kcals  Protein:  90-110 g  Fluid:  >/= 1.9 L  Kerman Passey MS, RDN, LDN, CNSC Registered Dietitian III Clinical Nutrition RD Pager and On-Call Pager Number Located in [place]

## 2021-02-14 NOTE — Progress Notes (Signed)
Patient has c/o severe pain 10/10 R mid back. Stated that [place] and Percocet doesn't help that much. Xenia Blount,NP made aware. New orders received and noted. Will continue to close monitor patient.

## 2021-02-15 DIAGNOSIS — C799 Secondary malignant neoplasm of unspecified site: Secondary | ICD-10-CM | POA: Diagnosis not present

## 2021-02-15 DIAGNOSIS — Z7189 Other specified counseling: Secondary | ICD-10-CM

## 2021-02-15 LAB — CBC
HCT: 26.7 % — ABNORMAL LOW (ref 36.0–46.0)
Hemoglobin: 8.7 g/dL — ABNORMAL LOW (ref 12.0–15.0)
MCH: 28.9 pg (ref 26.0–34.0)
MCHC: 32.6 g/dL (ref 30.0–36.0)
MCV: 88.7 fL (ref 80.0–100.0)
Platelets: 479 10*3/uL — ABNORMAL HIGH (ref 150–400)
RBC: 3.01 MIL/uL — ABNORMAL LOW (ref 3.87–5.11)
RDW: 13.5 % (ref 11.5–15.5)
WBC: 18.7 10*3/uL — ABNORMAL HIGH (ref 4.0–10.5)
nRBC: 0.1 % (ref 0.0–0.2)

## 2021-02-15 LAB — HEPARIN LEVEL (UNFRACTIONATED)
Heparin Unfractionated: 0.2 IU/mL — ABNORMAL LOW (ref 0.30–0.70)
Heparin Unfractionated: 0.26 IU/mL — ABNORMAL LOW (ref 0.30–0.70)
Heparin Unfractionated: 0.28 IU/mL — ABNORMAL LOW (ref 0.30–0.70)
Heparin Unfractionated: >1.1 IU/mL — ABNORMAL HIGH (ref 0.30–0.70)

## 2021-02-15 MED ORDER — DEXAMETHASONE 4 MG PO TABS
2.0000 mg | ORAL_TABLET | Freq: Three times a day (TID) | ORAL | Status: AC
  Administered 2021-02-15 – 2021-02-18 (×9): 2 mg via ORAL
  Filled 2021-02-15 (×9): qty 1

## 2021-02-15 MED ORDER — HEPARIN BOLUS VIA INFUSION
1000.0000 [IU] | Freq: Once | INTRAVENOUS | Status: AC
  Administered 2021-02-15: 1000 [IU] via INTRAVENOUS
  Filled 2021-02-15: qty 1000

## 2021-02-15 MED ORDER — OXYCODONE HCL ER 10 MG PO T12A
10.0000 mg | EXTENDED_RELEASE_TABLET | Freq: Two times a day (BID) | ORAL | Status: DC
  Administered 2021-02-15 – 2021-02-17 (×4): 10 mg via ORAL
  Filled 2021-02-15 (×4): qty 1

## 2021-02-15 MED ORDER — SERTRALINE HCL 50 MG PO TABS
25.0000 mg | ORAL_TABLET | Freq: Every day | ORAL | Status: DC
  Administered 2021-02-15 – 2021-02-18 (×4): 25 mg via ORAL
  Filled 2021-02-15 (×4): qty 1

## 2021-02-15 MED ORDER — OXYCODONE HCL 5 MG PO TABS
7.5000 mg | ORAL_TABLET | Freq: Four times a day (QID) | ORAL | Status: DC | PRN
Start: 2021-02-15 — End: 2021-02-17
  Administered 2021-02-15 – 2021-02-16 (×2): 7.5 mg via ORAL
  Filled 2021-02-15 (×2): qty 2

## 2021-02-15 MED ORDER — OXYCODONE-ACETAMINOPHEN 7.5-325 MG PO TABS
2.0000 | ORAL_TABLET | Freq: Four times a day (QID) | ORAL | Status: DC | PRN
  Administered 2021-02-15: 2 via ORAL
  Filled 2021-02-15: qty 2

## 2021-02-15 MED ORDER — POLYETHYLENE GLYCOL 3350 17 G PO PACK
17.0000 g | PACK | Freq: Two times a day (BID) | ORAL | Status: DC
  Administered 2021-02-15 – 2021-02-18 (×7): 17 g via ORAL
  Filled 2021-02-15 (×7): qty 1

## 2021-02-15 MED ORDER — OXYCODONE HCL 5 MG PO TABS
15.0000 mg | ORAL_TABLET | Freq: Four times a day (QID) | ORAL | Status: DC | PRN
Start: 2021-02-15 — End: 2021-02-17
  Administered 2021-02-16 – 2021-02-17 (×2): 15 mg via ORAL
  Filled 2021-02-15 (×2): qty 3

## 2021-02-15 MED ORDER — OXYCODONE HCL 5 MG PO TABS
15.0000 mg | ORAL_TABLET | Freq: Four times a day (QID) | ORAL | Status: DC | PRN
Start: 2021-02-15 — End: 2021-02-15

## 2021-02-15 MED ORDER — APIXABAN 5 MG PO TABS: 5.0000 mg | ORAL_TABLET | Freq: Two times a day (BID) | ORAL | Status: DC

## 2021-02-15 MED ORDER — HYDROMORPHONE HCL 1 MG/ML IJ SOLN
0.5000 mg | INTRAMUSCULAR | Status: DC | PRN
  Administered 2021-02-17: 11:00:00 1 mg via INTRAVENOUS
  Filled 2021-02-15: qty 1

## 2021-02-15 MED ORDER — APIXABAN 5 MG PO TABS
10.0000 mg | ORAL_TABLET | Freq: Two times a day (BID) | ORAL | Status: DC
  Administered 2021-02-15 – 2021-02-18 (×7): 10 mg via ORAL
  Filled 2021-02-15 (×7): qty 2

## 2021-02-15 NOTE — Progress Notes (Addendum)
ANTICOAGULATION CONSULT NOTE  Pharmacy Consult for IV heparin Indication: pulmonary embolus  No Known Allergies  Patient Measurements: Height: 5' (152.4 cm) Weight: 60.2 kg (132 lb 11.5 oz) IBW/kg (Calculated) : 45.5 Heparin Dosing Weight: 54.4kg  Vital Signs: Temp: 98 F (36.7 C) (01/22 0740) Temp Source: Oral (01/22 0740) BP: 108/56 (01/22 0740) Pulse Rate: 88 (01/22 0740)  Labs: Recent Labs    02/12/21 1518 02/13/21 0143 02/13/21 0143 02/14/21 0340 02/14/21 1432 02/15/21 0144 02/15/21 0458  HGB  --  8.3*   < > 7.8*  --  8.7*  --   HCT  --  26.4*  --  24.5*  --  26.7*  --   PLT  --  424*  --  371  --  479*  --   APTT 51* 85*  --  62*  --   --   --   HEPARINUNFRC  --  0.66   < > 0.23* 0.24* 0.20* 0.26*  CREATININE  --  0.54  --   --   --   --   --    < > = values in this interval not displayed.     Estimated Creatinine Clearance: 50.8 mL/min (by C-G formula based on SCr of 0.54 mg/dL).   Assessment: 67 YOF presenting with increased shortness of breath to the ED. PMH significant for newly diagnosed DVT in left femoral posterior tibial and peroneal veins on 02/03/2021. Pt was started on Eliquis 5 mg BID, last dose taken at 0900 02/11/21. CT angio positive for acute PE in right lower lobe with possible right ventricular strain. Pharmacy to dose IV heparin.  Heparin level remains below goal at 0.28, per RN no issues during heparin infusion. No signs or symptoms of bleeding.   Goal of Therapy:  Heparin level 0.3-0.7 units/ml Monitor platelets by anticoagulation protocol: Yes   Plan:   Increase heparin to 1700 units/h 8h heparin level Daily heparin level, CBC Monitor for signs/symptoms of bleeding Follow-up long-term anticoagulation plan  Thank you for involving pharmacy in this patient's care.  Lestine Box, PharmD PGY2 Infectious Diseases Pharmacy Resident   Please check [email or web address] for unit-specific pharmacy phone numbers

## 2021-02-15 NOTE — Evaluation (Signed)
Physical Therapy Evaluation Patient Details Name: Angie Rojas MRN: [id number] DOB: 01-19-1946 Today's Date: 02/15/2021  History of Present Illness  [name], 76 y.o. female presented to ED with complaint of abdominal pain; found to have PE (recent DVT) and started on heparin, back to Eliquis as of 1/21; Imaging concerning for malignancy (pancreatic and liver), and Oncology and Palliative Care consulted, as well as PT/OT; Abdominal and back pain have been present throughout hospital course; with PMH of anxiety at baseline, recent left femoral vein DVT about a wk PTA  Clinical Impression   Pt admitted with above diagnosis. Comes from home where she lives alone, but now family is arranging for 24 hour assistance; Presents to PT with gait and balance dysfunction, increased fall risk; Sands and walks with posterior bias, and has considerable dependence on UE support for balance with amb (reaching out for walls and furniture); Increased fall risk;  Pt currently with functional limitations due to the deficits listed below (see PT Problem List). Pt will benefit from skilled PT to increase their independence and safety with mobility to allow discharge to the venue listed below.    [name] does not want to try a RW, wanting to hold on to her independence; She is aware that seh reaches out for UE support; I wonder if she'd be more open to a rollator RW -- with the notion that if she is carrying something, she can use the seat to put things on (instead of reaching for walls while carrying it) -- and that would actually give her more independence.        Recommendations for follow up therapy are one component of a multi-disciplinary discharge planning process, led by the attending physician.  Recommendations may be updated based on patient status, additional functional criteria and insurance authorization.  Follow Up Recommendations Home health PT    Assistance Recommended at Discharge  Intermittent Supervision/Assistance  Patient can return home with the following  A little help with walking and/or transfers;Assistance with cooking/housework;Help with stairs or ramp for entrance    Equipment Recommendations Rollator (4 wheels);Other (comment) (Anticipate pt will be resistant)  Recommendations for Other Services  OT consult (as ordered)    Functional Status Assessment Patient has had a recent decline in their functional status and demonstrates the ability to make significant improvements in function in a reasonable and predictable amount of time.     Precautions / Restrictions Precautions Precautions: Fall Precaution Comments: Reaches out for UE support during in room ambulation      Mobility  Bed Mobility Overal bed mobility: Modified Independent             General bed mobility comments: Pt went between supine (HOB raised) and circle sit/long sit without difficulty first half of the session    Transfers Overall transfer level: Needs assistance Equipment used: None Transfers: Sit to/from Stand Sit to Stand: Min guard           General transfer comment: Clsoe guard for safety; mild posterior bias    Ambulation/Gait Ambulation/Gait assistance: Min guard, Min assist Gait Distance (Feet): 12 Feet Assistive device: None Gait Pattern/deviations: Decreased step length - right, Decreased step length - left Gait velocity: slowed     General Gait Details: Pt reaching out for UE supportfrom furniture/wall with every step; minguard, and occasional min assist for safety; declined assistive device  Stairs            Wheelchair Mobility    Modified Rankin (  Stroke Patients Only)       Balance Overall balance assessment: Needs assistance   Sitting balance-Leahy Scale: Good     Standing balance support: During functional activity Standing balance-Leahy Scale: Poor (Approaching Fair) Standing balance comment: Reaches out for UE support                              Pertinent Vitals/Pain Pain Assessment Pain Assessment: Faces Faces Pain Scale: Hurts little more Pain Location: Abdominal pain Pain Descriptors / Indicators: Discomfort, Grimacing, Guarding Pain Intervention(s): Monitored during session    Home Living Family/patient expects to be discharged to:: Private residence Living Arrangements: Children Available Help at Discharge: Family;Available 24 hours/day (Pt's adult children are arranging for 24 hour assistance at home) Type of Home: House Home Access: Stairs to enter   [employer] of Steps: 4   Home Layout: One level Home Equipment: None      Prior Function Prior Level of Function : Independent/Modified Independent             Mobility Comments: Drives a school bus for [employer]        Extremity/Trunk Assessment   Upper Extremity Assessment Upper Extremity Assessment: Defer to OT evaluation    Lower Extremity Assessment Lower Extremity Assessment: Generalized weakness       Communication   Communication: No difficulties;Other (comment) (Needs occasional redirection to task)  Cognition Arousal/Alertness: Awake/alert Behavior During Therapy: WFL for tasks assessed/performed, Impulsive Overall Cognitive Status: No family/caregiver present to determine baseline cognitive functioning                                 General Comments: Pt talks a lot, and occasionally requires redirection; at beginning of session, she explained that she takes a long time to brush her teeth, which was correct -- she brushed her teeth while sitting in in the bed for at least 15-20 minutes (able to answer home questions while brushing) before she was ready to walk into the bathroom        General Comments General comments (skin integrity, edema, etc.): Very focused on keeping her independence as long as she can; extremely hesitant to use a  RW, and declined trying one this session    Exercises     Assessment/Plan    PT Assessment Patient needs continued PT services  PT Problem List Decreased strength;Decreased activity tolerance;Decreased balance;Decreased mobility;Decreased coordination;Decreased cognition;Decreased knowledge of use of DME;Decreased safety awareness;Decreased knowledge of precautions;Pain       PT Treatment Interventions DME instruction;Gait training;Stair training;Functional mobility training;Therapeutic activities;Therapeutic exercise;Balance training;Neuromuscular re-education;Cognitive remediation;Patient/family education    PT Goals (Current goals can be found in the Care Plan section)  Acute Rehab PT Goals Patient Stated Goal: Get pain under control PT Goal Formulation: With patient Time For Goal Achievement: 03/01/21 Potential to Achieve Goals: Good    Frequency Min 3X/week     Co-evaluation               AM-PAC PT "6 Clicks" Mobility  Outcome Measure Help needed turning from your back to your side while in a flat bed without using bedrails?: None Help needed moving from lying on your back to sitting on the side of a flat bed without using bedrails?: None Help needed moving to and from a bed to a chair (including a  wheelchair)?: A Little Help needed standing up from a chair using your arms (e.g., wheelchair or bedside chair)?: A Little Help needed to walk in hospital room?: A Little Help needed climbing 3-5 steps with a railing? : A Lot 6 Click Score: 19    End of Session   Activity Tolerance: Patient tolerated treatment well Patient left: with nursing/sitter in room;Other (comment) (sitting at sink with Camryn, NT, to sponge bathe) Nurse Communication: Mobility status PT Visit Diagnosis: Unsteadiness on feet (R26.81)    Time: 1330-1409 PT Time Calculation (min) (ACUTE ONLY): 39 min   Charges:   PT Evaluation $PT Eval Moderate Complexity: 1 Mod PT Treatments $Gait  Training: 8-22 mins $Therapeutic Activity: 8-22 mins        Roney Marion, PT  Acute Rehabilitation Services Pager [phone] Office [phone]   Colletta [place] 02/15/2021, 6:14 PM

## 2021-02-15 NOTE — Progress Notes (Signed)
PT Cancellation Note  Patient Details Name: Angie Rojas MRN: [id number] DOB: [birth year]   Cancelled Treatment:    Reason Eval/Treat Not Completed: Other (comment)  Currently consulting with Palliative Medicine; They are requesting to return in an hour for PT evaluation;   Will make every effort to return for PT eval close to 1:30 PM, as time and caseload allow;   Otherwise, will follow up for PT tomorrow;   Thank you,  Roney Marion, PT  Acute Rehabilitation Services Pager [phone] Office [phone]    Angie Rojas 02/15/2021, 12:26 PM

## 2021-02-15 NOTE — Progress Notes (Signed)
ANTICOAGULATION CONSULT NOTE  Pharmacy Consult for IV heparin Indication: pulmonary embolus  No Known Allergies  Patient Measurements: Height: 5' (152.4 cm) Weight: 55.6 kg (122 lb 9.2 oz) IBW/kg (Calculated) : 45.5 Heparin Dosing Weight: 54.4kg  Vital Signs: Temp: 98.3 F (36.8 C) (01/21 2100) Temp Source: Axillary (01/21 2100) BP: 112/60 (01/21 2100) Pulse Rate: 100 (01/21 2100)  Labs: Recent Labs    02/12/21 0326 02/12/21 1518 02/13/21 0143 02/14/21 0340 02/14/21 1432 02/15/21 0144  HGB 8.1*  --  8.3* 7.8*  --  8.7*  HCT 27.0*  --  26.4* 24.5*  --  26.7*  PLT 460*  --  424* 371  --  479*  APTT  --  51* 85* 62*  --   --   HEPARINUNFRC  --   --  0.66 0.23* 0.24* 0.20*  CREATININE 0.52  --  0.54  --   --   --      Estimated Creatinine Clearance: 48.9 mL/min (by C-G formula based on SCr of 0.54 mg/dL).   Assessment: 39 YOF presenting with increased shortness of breath to the ED. PMH significant for newly diagnosed DVT in left femoral posterior tibial and peroneal veins on 02/03/2021. Pt was started on Eliquis 5 mg BID, last dose taken at 0900 02/11/21. CT angio positive for acute PE in right lower lobe with possible right ventricular strain. Pharmacy to dose IV heparin.  Heparin level remains subtherapeutic at 0.2, per RN no issues during heparin infusion. No signs or symptoms of bleeding.   Goal of Therapy:  Heparin level 0.3-0.7 units/ml Monitor platelets by anticoagulation protocol: Yes   Plan:  Bolus heparin 1000 units IV Increase IV heparin to 1600 units/h 8h heparin level Daily heparin level, CBC Monitor for signs/symptoms of bleeding Follow-up long-term anticoagulation plan  Thank you for involving pharmacy in this patient's care.  Albertina Parr, PharmD., BCPS, BCCCP Clinical Pharmacist Please refer to [facility] for unit-specific pharmacist

## 2021-02-15 NOTE — Progress Notes (Signed)
PROGRESS NOTE    Angie Rojas  BJY:[id number] DOB: Jun 10, 1945 DOA: 02/11/2021 PCP: Lajean Manes, MD   Chief Complaint  Patient presents with   Shortness of Breath   Brief Narrative/Hospital Course: Angie Rojas, 76 y.o. female with PMH of anxiety at baseline, recent left femoral vein DVT about a wk PTA and placed on Eliquis by PCP presented to ED with complaint of abdominal pain. Has had abdominal pain 3 to 4 weeks intermittent constant with radiation to the back 10 out of 10 in severity without improvement from pain medication.. She has had intermittent shortness of breath and chest pain  ED Course: Upon arrival to ED, she was tachycardic. cxr negative for any acute pathology.  CT angiogram of chest showed acute PE in segmental and subsegmental branches in the right lower lobe, dilatation of right ventricular cavity suggesting possible right ventricular strain.  No aortic dissection.  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  Borderline retroperitoneal lymph nodes.  Fibroid.  Patient was given IV pain medication and placed on heparin drip and admitted for further work-up.    Subjective: Percocet started yesterday at 7/2 mg p.o. 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  Change to po percocet and add colace.  Discussed with Dr. [name] form hem-onc--will need to call oncology postbiopsy result.  CEA level elevated at 70.   Palliative care consult and family meeting underway  Right lower lobe PE Recent left lower leg DVT a wk ago and was started on eliquis: Not eloquis failure, resume. Stop hep drip  Leukocytosis likely in the setting of #1 no evidence of infection.  Monitor CBC  Anemia suspect from  chronic disease, downtrending, monitor hemoglobin transfuse if less than 7 g   Anxiety disorder initiated on Xanax tid prn.  Mild transaminitis likely from liver mets.  Monitor  DVT prophylaxis: Heparin gtt Code Status:   Code Status: Full Code Family Communication: plan of care discussed with patient and her son at bedside. Status is: Inpatient Remains inpatient appropriate because: Remains inpatient for ongoing management of PE and work-up of metastatic disease Disposition: when pain better controlled, oncology final recs, palliative care consult pending  Total time spent in the care of this patient 35 MINUTES Objective: Vitals last 24 hrs: Vitals:   02/14/21 2100 02/15/21 0500 02/15/21 0522 02/15/21 0740  BP: 112/60  (!) 96/48 (!) 108/56  Pulse: 100  93 88  Resp:   20 16  Temp: 98.3 F (36.8 C)  98.2 F (36.8 C) 98 F (36.7 C)  TempSrc: Axillary  Oral Oral  SpO2: 98%  95% 93%  Weight:  60.2 kg    Height:       Weight change: 4.6 kg  Intake/Output Summary (Last 24 hours) at 02/15/2021 1151 Last data filed at 02/15/2021 1000 Gross per 24 hour  Intake 1896.5 ml  Output --  Net 1896.5 ml   Net IO Since Admission: 2,496.5 mL [02/15/21 1151]   Physical Examination: General exam: AAOx 3, anxious older than stated age, weak appearing. HEENT:Oral mucosa moist, Ear/Nose WNL grossly, dentition normal. Respiratory system: bilaterally clear, no use of accessory muscle Cardiovascular system: S1 & S2 +, No JVD,. Gastrointestinal system: Abdomen soft, NT,ND, BS+ Nervous System:Alert, awake, moving extremities and grossly  nonfocal Extremities: no edema, distal peripheral pulses palpable.  Skin: No rashes,no icterus. MSK: Normal muscle bulk,tone, power   Medications reviewed:  Scheduled Meds:  docusate sodium  100 mg Oral BID   feeding supplement  237 mL Oral BID BM   multivitamin with minerals  1 tablet Oral Daily   sodium chloride flush  3 mL Intravenous Q12H   Continuous  Infusions:  sodium chloride 100 mL/hr at 02/15/21 0659   heparin 1,700 Units/hr (02/15/21 1128)   Diet Order             Diet regular Room service appropriate? Yes; Fluid consistency: Thin  Diet effective now                 Weight change: 4.6 kg  Wt Readings from Last 3 Encounters:  02/15/21 60.2 kg  Consultants:see note  Procedures:see note Antimicrobials: Anti-infectives (From admission, onward)    None      Culture/Microbiology No results found for: SDES, SPECREQUEST, CULT, REPTSTATUS  Other culture-see note  Unresulted Labs (From admission, onward)     Start     Ordered   02/15/21 1900  Heparin level (unfractionated)  Once-Timed,   TIMED        02/15/21 1113   02/15/21 0500  Heparin level (unfractionated)  Daily,   R      02/13/21 1456   02/12/21 0500  CBC  Daily,   R      02/11/21 1705          Data Reviewed: I have personally reviewed following labs and imaging studies CBC: Recent Labs  Lab 02/11/21 1324 02/12/21 0326 02/13/21 0143 02/14/21 0340 02/15/21 0144  WBC 19.4* 18.1* 15.7* 17.4* 18.7*  NEUTROABS 15.6*  --   --   --   --   HGB 9.2* 8.1* 8.3* 7.8* 8.7*  HCT 29.5* 27.0* 26.4* 24.5* 26.7*  MCV 89.1 92.8 89.8 89.7 88.7  PLT 542* 460* 424* 371 824*   Basic Metabolic Panel: Recent Labs  Lab 02/11/21 1324 02/11/21 1728 02/12/21 0326 02/13/21 0143  NA 135  --  132* 137  K 3.9  --  3.7 4.1  CL 95*  --  103 104  CO2 23  --  22 26  GLUCOSE 116*  --  113* 121*  BUN 11  --  8 5*  CREATININE 0.55  --  0.52 0.54  CALCIUM 9.7  --  8.0* 8.6*  MG  --  2.0  --   --    GFR: Estimated Creatinine Clearance: 50.8 mL/min (by C-G formula based on SCr of 0.54 mg/dL). Liver Function Tests: Recent Labs  Lab 02/11/21 1324 02/12/21 0326  AST 61* 56*  ALT 59* 50*  ALKPHOS 413* 334*  BILITOT 0.9 0.6  PROT 6.8 5.6*  ALBUMIN 2.7* 2.2*   Recent Labs  Lab 02/11/21 1324  LIPASE 32   No results for input(s): AMMONIA in the last 168  hours. Coagulation Profile: No results for input(s): INR, PROTIME in the last 168 hours. Cardiac Enzymes: No results for input(s): CKTOTAL, CKMB, CKMBINDEX, TROPONINI in the last 168 hours. BNP (last 3 results) No results for input(s): PROBNP in the last 8760 hours. HbA1C: No results for input(s): HGBA1C in the last 72 hours. CBG: No results for input(s): GLUCAP in the last 168 hours. Lipid Profile: No results for input(s): CHOL, HDL, LDLCALC, TRIG, CHOLHDL, LDLDIRECT in the last 72 hours. Thyroid Function Tests: No results for input(s): TSH, T4TOTAL, FREET4, T3FREE, THYROIDAB in  the last 72 hours.  Anemia Panel: No results for input(s): VITAMINB12, FOLATE, FERRITIN, TIBC, IRON, RETICCTPCT in the last 72 hours. Sepsis Labs: No results for input(s): PROCALCITON, LATICACIDVEN in the last 168 hours.  Recent Results (from the past 240 hour(s))  Resp Panel by RT-PCR (Flu A&B, Covid) Nasopharyngeal Swab     Status: None   Collection Time: 02/11/21  5:01 PM   Specimen: Nasopharyngeal Swab; Nasopharyngeal(NP) swabs in vial transport medium  Result Value Ref Range Status   SARS Coronavirus 2 by RT PCR NEGATIVE NEGATIVE Final    Comment: (NOTE) SARS-CoV-2 target nucleic acids are NOT DETECTED.  The SARS-CoV-2 RNA is generally detectable in upper respiratory specimens during the acute phase of infection. The lowest concentration of SARS-CoV-2 viral copies this assay can detect is 138 copies/mL. A negative result does not preclude SARS-Cov-2 infection and should not be used as the sole basis for treatment or other patient management decisions. A negative result may occur with  improper specimen collection/handling, submission of specimen other than nasopharyngeal swab, presence of viral mutation(s) within the areas targeted by this assay, and inadequate number of viral copies(<138 copies/mL). A negative result must be combined with clinical observations, patient history, and  epidemiological information. The expected result is Negative.  Fact Sheet for Patients:  EntrepreneurPulse.com.au  Fact Sheet for Healthcare Providers:  IncredibleEmployment.be  This test is no t yet approved or cleared by the [name] FDA and  has been authorized for detection and/or diagnosis of SARS-CoV-2 by FDA under an Emergency Use Authorization (EUA). This EUA will remain  in effect (meaning this test can be used) for the duration of the COVID-19 declaration under Section 564(b)(1) of the Act, 21 U.S.C.section 360bbb-3(b)(1), unless the authorization is terminated  or revoked sooner.       Influenza A by PCR NEGATIVE NEGATIVE Final   Influenza B by PCR NEGATIVE NEGATIVE Final    Comment: (NOTE) The Xpert Xpress SARS-CoV-2/FLU/RSV plus assay is intended as an aid in the diagnosis of influenza from Nasopharyngeal swab specimens and should not be used as a sole basis for treatment. Nasal washings and aspirates are unacceptable for Xpert Xpress SARS-CoV-2/FLU/RSV testing.  Fact Sheet for Patients: EntrepreneurPulse.com.au  Fact Sheet for Healthcare Providers: IncredibleEmployment.be  This test is not yet approved or cleared by the [name] FDA and has been authorized for detection and/or diagnosis of SARS-CoV-2 by FDA under an Emergency Use Authorization (EUA). This EUA will remain in effect (meaning this test can be used) for the duration of the COVID-19 declaration under Section 564(b)(1) of the Act, 21 U.S.C. section 360bbb-3(b)(1), unless the authorization is terminated or revoked.  Performed at [facility], [place] [address] [place], [place] [address]      Radiology Studies: US BIOPSY (LIVER)  Result Date: 02/13/2021 INDICATION: Liver metastases, suspect pancreas primary by imaging. EXAM: ULTRASOUND CORE BIOPSY RIGHT LIVER METASTASIS MEDICATIONS: 1% LIDOCAINE LOCAL  ANESTHESIA/SEDATION: Moderate (conscious) sedation was employed during this procedure. A total of Versed 1.0 mg and Fentanyl 25 mcg was administered intravenously by the radiology nurse. Total intra-service moderate Sedation Time: 7 minutes. The patient's level of consciousness and vital signs were monitored continuously by radiology nursing throughout the procedure under my direct supervision. FLUOROSCOPY TIME:  Fluoroscopy Time: None. COMPLICATIONS: None immediate. PROCEDURE: Informed written consent was obtained from the patient after a thorough discussion of the procedural risks, benefits and alternatives. All questions were addressed. Maximal Sterile Barrier Technique was utilized including caps, mask, sterile gowns,  sterile gloves, sterile drape, hand hygiene and skin antiseptic. A timeout was performed prior to the initiation of the procedure. Previous imaging reviewed. Preliminary ultrasound performed. A right hepatic lesion was localized in the mid axillary line through a lower intercostal space. Overlying skin marked. Under sterile conditions and local anesthesia, a 17 gauge coaxial guide needle was advanced into the lesion. Needle position confirmed with ultrasound. 18 gauge core biopsies obtained. Samples placed in formalin. Needle tract occluded with Gel-Foam. Postprocedure imaging demonstrates no hemorrhage or hematoma. Patient tolerated biopsy well. IMPRESSION: Successful ultrasound core biopsy of the right liver metastasis. Electronically Signed   By: Jerilynn Mages.  Shick M.D.   On: 02/13/2021 15:10     LOS: 4 days   Phillips Grout, MD [facility]  02/15/2021, 11:51 AM   Patient ID: Angie Rojas, female   DOB: 08-30-1945, 76 y.o.   MRN: [id number]

## 2021-02-15 NOTE — TOC Progression Note (Signed)
Transition of Care [facility]) - Progression Note    Patient Details  Name: Angie Rojas MRN: [id number] Date of Birth: 1945/05/10  Transition of Care [facility]) CM/SW Contact  Zenon Mayo, RN Phone Number: 02/15/2021, 2:20 PM  Clinical Narrative:     Transition of Care [facility]) Screening Note   Patient Details  Name: Angie Rojas Date of Birth: Jan 25, 1946   Transition of Care [facility]) CM/SW Contact:    Zenon Mayo, RN Phone Number: 02/15/2021, 2:20 PM    Transition of Care Department [facility]) has reviewed patient and no TOC needs have been identified at this time. We will continue to monitor patient advancement through interdisciplinary progression rounds. If new patient transition needs arise, please place a TOC consult.          Expected Discharge Plan and Services                                                 Social Determinants of Health (SDOH) Interventions    Readmission Risk Interventions No flowsheet data found.

## 2021-02-16 DIAGNOSIS — C799 Secondary malignant neoplasm of unspecified site: Secondary | ICD-10-CM

## 2021-02-16 DIAGNOSIS — E43 Unspecified severe protein-calorie malnutrition: Secondary | ICD-10-CM | POA: Insufficient documentation

## 2021-02-16 LAB — URINALYSIS, ROUTINE W REFLEX MICROSCOPIC
Bilirubin Urine: NEGATIVE
Glucose, UA: NEGATIVE mg/dL
Hgb urine dipstick: NEGATIVE
Ketones, ur: NEGATIVE mg/dL
Leukocytes,Ua: NEGATIVE
Nitrite: NEGATIVE
Protein, ur: NEGATIVE mg/dL
Specific Gravity, Urine: 1.016 (ref 1.005–1.030)
pH: 6 (ref 5.0–8.0)

## 2021-02-16 LAB — CBC
HCT: 27.9 % — ABNORMAL LOW (ref 36.0–46.0)
Hemoglobin: 8.7 g/dL — ABNORMAL LOW (ref 12.0–15.0)
MCH: 27.9 pg (ref 26.0–34.0)
MCHC: 31.2 g/dL (ref 30.0–36.0)
MCV: 89.4 fL (ref 80.0–100.0)
Platelets: 496 10*3/uL — ABNORMAL HIGH (ref 150–400)
RBC: 3.12 MIL/uL — ABNORMAL LOW (ref 3.87–5.11)
RDW: 14 % (ref 11.5–15.5)
WBC: 16.7 10*3/uL — ABNORMAL HIGH (ref 4.0–10.5)
nRBC: 0 % (ref 0.0–0.2)

## 2021-02-16 LAB — BASIC METABOLIC PANEL
Anion gap: 12 (ref 5–15)
BUN: 5 mg/dL — ABNORMAL LOW (ref 8–23)
CO2: 21 mmol/L — ABNORMAL LOW (ref 22–32)
Calcium: 8.5 mg/dL — ABNORMAL LOW (ref 8.9–10.3)
Chloride: 102 mmol/L (ref 98–111)
Creatinine, Ser: 0.51 mg/dL (ref 0.44–1.00)
GFR, Estimated: >60 mL/min (ref >60)
Glucose, Bld: 119 mg/dL — ABNORMAL HIGH (ref 70–99)
Potassium: 4 mmol/L (ref 3.5–5.1)
Sodium: 135 mmol/L (ref 135–145)

## 2021-02-16 LAB — HEPARIN LEVEL (UNFRACTIONATED): Heparin Unfractionated: >1.1 IU/mL — ABNORMAL HIGH (ref 0.30–0.70)

## 2021-02-16 MED ORDER — BISACODYL 10 MG RE SUPP: 10.0000 mg | Freq: Every day | RECTAL | Status: DC | PRN

## 2021-02-16 MED ORDER — ENSURE ENLIVE PO LIQD
237.0000 mL | Freq: Three times a day (TID) | ORAL | Status: DC
  Administered 2021-02-16 – 2021-02-18 (×6): 237 mL via ORAL
  Filled 2021-02-16 (×2): qty 237

## 2021-02-16 MED ORDER — PANCRELIPASE (LIP-PROT-AMYL) 12000-38000 UNITS PO CPEP
12000.0000 [IU] | ORAL_CAPSULE | Freq: Three times a day (TID) | ORAL | Status: DC
  Administered 2021-02-16 – 2021-02-18 (×6): 12000 [IU] via ORAL
  Filled 2021-02-16 (×6): qty 1

## 2021-02-16 MED ORDER — BISACODYL 5 MG PO TBEC
10.0000 mg | DELAYED_RELEASE_TABLET | Freq: Once | ORAL | Status: AC
  Administered 2021-02-16: 10 mg via ORAL
  Filled 2021-02-16: qty 2

## 2021-02-16 NOTE — Progress Notes (Signed)
Nutrition Follow-up  DOCUMENTATION CODES:  Severe malnutrition in context of chronic illness  INTERVENTION:  Increase Ensure Enlive po to TID, each supplement provides 350 kcal and 20 grams of protein. Vanilla flavor MVI with Minerals Continue Regular diet, encourage PO intake Provided education on increasing kcal and protein of meals and snacks. Handout given.  NUTRITION DIAGNOSIS:  Severe Malnutrition (in the context of chronic illness) related to poor appetite, nausea as evidenced by severe muscle depletion, severe fat depletion.  GOAL:  Patient will meet greater than or equal to 90% of their needs  MONITOR:  PO intake, Supplement acceptance, Weight trends, Labs  REASON FOR ASSESSMENT:  Consult Assessment of nutrition requirement/status  ASSESSMENT:  76 yo female admitted with abdominal pain with imaging concerning for pancreatic mass and liver lesions concerning for metastatic malignancy, CA 19-9 significantly elevated.  1/20 Liver biopsy-result pending  Pt resting in bed at the time of assessment. Sons at bedside assist with nutrition hx. Pt reports that she ate well this AM, but yesterday was not a good day for her due to unmanaged pain and GI distress. Pt reports no BM for 8-9 days. Palliative care now managing pain and bowel regimen.   Discussed intake PTA, pt has eaten very little for the past 3-4 weeks due to ongoing abdominal pain and nausea. Has also been taking in very few fluids. Significant depletions present on physical exam. Weight loss endorsed, but pt unsure af amount. Family endorses weight loss as well. Discussed the importance of taking in adequate nutrition and encouraged pt to consume protein rich foods first on plate to try and maintain muscle mass. Pt has tried Ensure products in the past and is agreeable to continue receiving. Prefers vanilla flavor. Provided pt with educational handout on high kcal/protein foods and small frequent meals.  At discharge, pt  states she will have family staying with her at home to assist with care. Family aware of nutrition recommendations.   Discussed pt with attending and palliative care team. Palliative care added pancreatic enzymes to monitor for positive impact on GI distress after eating.   Average Meal Intake: 1/19-1/23: 25% intake x 5 recorded meals  Nutritionally Relevant Medications: Scheduled Meds:  apixaban  10 mg Oral BID   dexamethasone  2 mg Oral Q8H   docusate sodium  100 mg Oral BID   feeding supplement  237 mL Oral BID BM   lipase/protease/amylase  12,000 Units Oral TID AC   multivitamin with minerals  1 tablet Oral Daily   polyethylene glycol  17 g Oral BID   Continuous Infusions:  sodium chloride 50 mL/hr at 02/16/21 1147   PRN Meds: bisacodyl, ondansetron  Labs Reviewed: BUN 5  Lab Results  Component Value Date   ALT 50 (H) 02/12/2021   AST 56 (H) 02/12/2021   ALKPHOS 334 (H) 02/12/2021   BILITOT 0.6 02/12/2021   NUTRITION - FOCUSED PHYSICAL EXAM: Flowsheet Row Most Recent Value  Orbital Region Moderate depletion  Upper Arm Region Severe depletion  Thoracic and Lumbar Region Severe depletion  Buccal Region Moderate depletion  Temple Region Mild depletion  Clavicle Bone Region Mild depletion  Clavicle and Acromion Bone Region Mild depletion  Scapular Bone Region Moderate depletion  Dorsal Hand Severe depletion  Patellar Region Severe depletion  Anterior Thigh Region Severe depletion  Posterior Calf Region Severe depletion  Edema (RD Assessment) None  Hair Reviewed  Eyes Reviewed  Mouth Reviewed  Skin Reviewed  Nails Reviewed   Diet Order:  Diet Order             Diet regular Room service appropriate? Yes; Fluid consistency: Thin  Diet effective now                   EDUCATION NEEDS:  Education needs have been addressed  Skin:  Skin Assessment: Skin Integrity Issues: Skin Integrity Issues:: Incisions Incisions: puncture to abdomen-liver  biopsy  Last BM:  unsure - pt reports none in 8-9 days (bowel regimen in place)  Height:  Ht Readings from Last 1 Encounters:  02/11/21 5' (1.524 m)    Weight:  Wt Readings from Last 1 Encounters:  02/15/21 60.2 kg    BMI:  Body mass index is 25.92 kg/m.  Estimated Nutritional Needs:  Kcal:  1800-2000 kcal/d Protein:  90-110 g Fluid:  1.8-2.2 L/d  Ranell Patrick, RD, LDN Clinical Dietitian RD pager # available in AMION  After hours/weekend pager # available in [facility]

## 2021-02-16 NOTE — Evaluation (Signed)
Occupational Therapy Evaluation Patient Details Name: Angie Rojas MRN: [id number] DOB: [birth year] Today's Date: 02/16/2021   History of Present Illness Angie Rojas, 76 y.o. female presented to ED with complaint of abdominal pain; found to have PE (recent DVT) and started on heparin, back to Eliquis as of 1/21; Imaging concerning for malignancy (pancreatic and liver), and Oncology and Palliative Care consulted, as well as PT/OT; Abdominal and back pain have been present throughout hospital course; with PMH of anxiety at baseline, recent left femoral vein DVT about a wk PTA   Clinical Impression   Pt reports independence at baseline, as she was working and living alone PTA. Pt currently supervision for ADLs, mod I for bed mobility, and supervision for transfers. Pt uses surroundings (wall, bedrails, counter top) for support during mobility, however does not lose balance. Trialed rollator for short distance in room with pt, demonstrates ability to lock/unlock brakes but reports she will not be using this much at home. Pt presenting with impairments listed below, is close to baseline, will follow acutely. Recommend d/c home with [place].      Recommendations for follow up therapy are one component of a multi-disciplinary discharge planning process, led by the attending physician.  Recommendations may be updated based on patient status, additional functional criteria and insurance authorization.   Follow Up Recommendations  Home health OT    Assistance Recommended at Discharge Intermittent Supervision/Assistance  Patient can return home with the following A little help with walking and/or transfers;A little help with bathing/dressing/bathroom;Assistance with cooking/housework    Functional Status Assessment  Patient has had a recent decline in their functional status and demonstrates the ability to make significant improvements in function in a reasonable and predictable amount of time.   Equipment Recommendations  None recommended by OT;Other (comment) (pt has all necessary DME)    Recommendations for Other Services       Precautions / Restrictions Precautions Precautions: Fall Precaution Comments: Reaches out for UE support during in room ambulation Restrictions Weight Bearing Restrictions: No      Mobility Bed Mobility Overal bed mobility: Modified Independent             General bed mobility comments: pt able to move sit > supine with mod I    Transfers Overall transfer level: Needs assistance Equipment used: Rollator (4 wheels) Transfers: Sit to/from Stand Sit to Stand: Supervision           General transfer comment: uses wall/door/furniture to walk within room      Balance Overall balance assessment: Needs assistance   Sitting balance-Leahy Scale: Good     Standing balance support: During functional activity Standing balance-Leahy Scale: Fair Standing balance comment: reaches for UE support                           ADL either performed or assessed with clinical judgement   ADL Overall ADL's : Needs assistance/impaired Eating/Feeding: Independent;Sitting   Grooming: Oral care;Wash/dry hands;Standing;Supervision/safety Grooming Details (indicate cue type and reason): completes oral hygiene at sink Upper Body Bathing: Supervision/ safety;Sitting   Lower Body Bathing: Supervison/ safety;Sitting/lateral leans   Upper Body Dressing : Supervision/safety;Sitting   Lower Body Dressing: Supervision/safety;Sitting/lateral leans Lower Body Dressing Details (indicate cue type and reason): dons sock long sitting in chair Toilet Transfer: Supervision/safety;Ambulation;Regular Glass blower/designer Details (indicate cue type and reason): simulated in room Toileting- Clothing Manipulation and Hygiene: Supervision/safety;Sit to/from stand  Functional mobility during ADLs: Supervision/safety       Vision Baseline  Vision/History: 1 Wears glasses Vision Assessment?: No apparent visual deficits     Perception     Praxis      Pertinent Vitals/Pain Pain Assessment Pain Assessment: Faces Pain Score: 2  Pain Location: back and r side Pain Descriptors / Indicators: Discomfort, Grimacing, Guarding Pain Intervention(s): Limited activity within patient's tolerance, Monitored during session, Repositioned     Hand Dominance     Extremity/Trunk Assessment Upper Extremity Assessment Upper Extremity Assessment: Overall WFL for tasks assessed   Lower Extremity Assessment Lower Extremity Assessment: Defer to PT evaluation   Cervical / Trunk Assessment Cervical / Trunk Assessment: Normal   Communication Communication Communication: No difficulties   Cognition Arousal/Alertness: Awake/alert Behavior During Therapy: WFL for tasks assessed/performed, Impulsive Overall Cognitive Status: No family/caregiver present to determine baseline cognitive functioning                                 General Comments: requires frequent redirection to task, and repetition of questions/instructions     General Comments  not wanting to use AD/AE prematurely, educated pt on rollator as supportive vs. dependent.  Pt able to appropriately lock/unlock breakes and walk short distance with Rollator    Exercises     Shoulder Instructions      Home Living Family/patient expects to be discharged to:: Private residence Living Arrangements: Children Available Help at Discharge: Family;Available 24 hours/day Type of Home: House Home Access: Stairs to enter [employer] of Steps: 4   Home Layout: One level     Bathroom Shower/Tub: Tub/shower unit;Walk-in shower   Bathroom Toilet: Handicapped height (in Special educational needs teacher)     Home Equipment: [place] - built in   Additional Comments: plans to get adjustable frame bed for home, plans to install handrails?      Prior Functioning/Environment  Prior Level of Function : Independent/Modified Independent             Mobility Comments: does not use AD for mobility ADLs Comments: Drives a school bus for [employer]        OT Problem List: Impaired balance (sitting and/or standing);Decreased strength;Decreased safety awareness;Decreased knowledge of use of DME or AE      OT Treatment/Interventions: Self-care/ADL training;Energy conservation;DME and/or AE instruction;Balance training;Patient/family education;Therapeutic activities    OT Goals(Current goals can be found in the care plan section) Acute Rehab OT Goals Patient Stated Goal: to go home OT Goal Formulation: With patient Time For Goal Achievement: 03/02/21 Potential to Achieve Goals: Good ADL Goals Pt Will Perform Lower Body Dressing: Independently;sit to/from stand Pt Will Transfer to Toilet: Independently;ambulating;regular height toilet Pt Will Perform Tub/Shower Transfer: Shower transfer;Independently;shower seat;ambulating  OT Frequency: Min 2X/week    Co-evaluation              AM-PAC OT "6 Clicks" Daily Activity     Outcome Measure Help from another person eating meals?: None Help from another person taking care of personal grooming?: None Help from another person toileting, which includes using toliet, bedpan, or urinal?: A Little Help from another person bathing (including washing, rinsing, drying)?: A Little Help from another person to put on and taking off regular upper body clothing?: None Help from another person to put on and taking off regular lower body clothing?: A Little 6 Click Score: 21   End of Session Equipment Utilized During Treatment:  Rollator (4 wheels);Gait belt Nurse Communication: Mobility status  Activity Tolerance: Patient tolerated treatment well Patient left: in bed;with call bell/phone within reach;with bed alarm set;with family/visitor present  OT Visit Diagnosis: Unsteadiness on feet (R26.81);Other  abnormalities of gait and mobility (R26.89)                Time: 0321-2248 OT Time Calculation (min): 26 min Charges:  OT General Charges $OT Visit: 1 Visit OT Evaluation $OT Eval Low Complexity: 1 Low OT Treatments $Self Care/Home Management : 8-22 mins  Lynnda Child, OTD, OTR/L Acute Rehab [phone]) [phone] - [place] 02/16/2021, 12:15 PM

## 2021-02-16 NOTE — Progress Notes (Signed)
PROGRESS NOTE    Angie Rojas  AJO:[id number] DOB: 1945-10-17 DOA: 02/11/2021 PCP: Lajean Manes, MD   Brief Narrative: 76 year old past medical history significant for anxiety, recent left femoral vein DVT about a week prior to admission placed on Eliquis by PCP presents to the ED complaining of abdominal pain.  She has had abdominal pain for 3 to 4 weeks intermittent and constant radiation to the back 10 out of 10 in severity without improvement with medications.  She also has had intermittent shortness of breath and chest pain.  Evaluation in the ED she was tachycardic, chest x-ray was negative.  CT angio chest show acute PE segmental and subsegmental branches of the right lower lobe, dilation of the right ventricular cavity suggesting possible right ventricular strain.  No aortic dissection, CT abdomen showed diffuse hepatic metastatic disease with pancreatic tail as well as pancreatic body lesion, likely primary neoplasm.  Borderline retroperitoneal lymph nodes.  Fibroid.  Patient was given IV pain medication and placed on heparin drip and admitted for further work-up.  She underwent ultrasound core biopsy of the liver on 1/20 by IR.   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 -Appreciate  palliative helping with management of pain. -Patient report improvement of pain control.   2-Right lower lobe PE, recent left lower leg DVT This was not considered Eliquis failure, she probably had PE the same time she had diagnosis of the DVT -She was initially on heparin drip. Transition to Eliquis 1/22.  --RV function normal by ECHO.  3-Leukocytosis: Reactive, in the setting of  malignancy.  No evidence of infection. She is now on Decadron, which could cause leukocytosis.   Anemia: Suspect anemia of chronic disease.  Continue to monitor stable  Anxiety: Continue with Xanax 3 times daily  Transaminases: Likely from liver mets  Dysuria; Check UA>  Constipation; started on bowel regimen.   Nutrition Problem: Inadequate oral intake Etiology: nausea, poor appetite, vomiting, cancer and cancer related treatments (abdominal pain)    Signs/Symptoms: per patient/family report    Interventions: Ensure Enlive (each supplement provides 350kcal and 20 grams of protein), MVI, Liberalize Diet  Estimated body mass index is 25.92 kg/m as calculated from the following:   Height as of this encounter: 5' (1.524 m).   Weight as of this encounter: 60.2 kg.   DVT prophylaxis:  Code Status:  Family Communication: Disposition Plan:  Status is: Inpatient  Remains inpatient appropriate because: pain management. Hopefully home soon when pain is controlled.         Consultants:  IR Oncology  Palliative,   Procedures:  Liver Bx  Antimicrobials:    Subjective: She was having severe pain yesterday. She call multiples times yesterday for severe pain. She is feeling pain is a little better controlled today.  No BM in a week. Report abdominal fullness.   Objective: Vitals:   02/15/21 1526 02/15/21 2042 02/16/21 0400 02/16/21 0741  BP: 113/65 (!) 101/51 115/68 98/75  Pulse: 90 (!) 107 82 91  Resp: 17 18 16 16   Temp: (!) 97.5 F (36.4 C) 98.4 F (36.9 C) 98.5 F (36.9 C) 98.4 F (36.9 C)  TempSrc: Oral Oral Oral Oral  SpO2: 100% (!) 86% 97%  95%  Weight:      Height:        Intake/Output Summary (Last 24 hours) at 02/16/2021 1031 Last data filed at 02/16/2021 0528 Gross per 24 hour  Intake 1389.82 ml  Output --  Net 1389.82 ml   Filed Weights   02/11/21 1603 02/14/21 0500 02/15/21 0500  Weight: 54.4 kg 55.6 kg 60.2 kg    Examination:  General  exam: Appears calm and comfortable  Respiratory system: Clear to auscultation. Respiratory effort normal. Cardiovascular system: S1 & S2 heard, RRR. No JVD, murmurs, rubs, gallops or clicks. No pedal edema. Gastrointestinal system: Abdomen is nondistended, soft, mild tender.  Central nervous system: Alert and oriented. . Extremities: Symmetric 5 x 5 power.    Data Reviewed: I have personally reviewed following labs and imaging studies  CBC: Recent Labs  Lab 02/11/21 1324 02/12/21 0326 02/13/21 0143 02/14/21 0340 02/15/21 0144 02/16/21 0147  WBC 19.4* 18.1* 15.7* 17.4* 18.7* 16.7*  NEUTROABS 15.6*  --   --   --   --   --   HGB 9.2* 8.1* 8.3* 7.8* 8.7* 8.7*  HCT 29.5* 27.0* 26.4* 24.5* 26.7* 27.9*  MCV 89.1 92.8 89.8 89.7 88.7 89.4  PLT 542* 460* 424* 371 479* 841*   Basic Metabolic Panel: Recent Labs  Lab 02/11/21 1324 02/11/21 1728 02/12/21 0326 02/13/21 0143 02/16/21 0147  NA 135  --  132* 137 135  K 3.9  --  3.7 4.1 4.0  CL 95*  --  103 104 102  CO2 23  --  22 26 21*  GLUCOSE 116*  --  113* 121* 119*  BUN 11  --  8 5* 5*  CREATININE 0.55  --  0.52 0.54 0.51  CALCIUM 9.7  --  8.0* 8.6* 8.5*  MG  --  2.0  --   --   --    GFR: Estimated Creatinine Clearance: 50.8 mL/min (by C-G formula based on SCr of 0.51 mg/dL). Liver Function Tests: Recent Labs  Lab 02/11/21 1324 02/12/21 0326  AST 61* 56*  ALT 59* 50*  ALKPHOS 413* 334*  BILITOT 0.9 0.6  PROT 6.8 5.6*  ALBUMIN 2.7* 2.2*   Recent Labs  Lab 02/11/21 1324  LIPASE 32   No results for input(s): AMMONIA in the last 168 hours. Coagulation Profile: No results for input(s): INR, PROTIME in the last 168 hours. Cardiac Enzymes: No results for input(s): CKTOTAL, CKMB, CKMBINDEX, TROPONINI in the last 168 hours. BNP (last 3 results) No results for input(s): PROBNP in the last 8760 hours. HbA1C: No results for input(s): HGBA1C in the last 72 hours. CBG: No results for input(s): GLUCAP in the last 168  hours. Lipid Profile: No results for input(s): CHOL, HDL, LDLCALC, TRIG, CHOLHDL, LDLDIRECT in the last 72 hours. Thyroid Function Tests: No results for input(s): TSH, T4TOTAL, FREET4, T3FREE, THYROIDAB in the last 72 hours. Anemia Panel: No results for input(s): VITAMINB12, FOLATE, FERRITIN, TIBC, IRON, RETICCTPCT in the last 72 hours. Sepsis Labs: No results for input(s): PROCALCITON, LATICACIDVEN in the last 168 hours.  Recent Results (from the past 240 hour(s))  Resp Panel by RT-PCR (Flu A&B, Covid) Nasopharyngeal Swab     Status: None   Collection Time: 02/11/21  5:01 PM   Specimen: Nasopharyngeal Swab; Nasopharyngeal(NP) swabs in vial transport medium  Result Value Ref Range Status   SARS Coronavirus 2 by RT PCR NEGATIVE NEGATIVE Final    Comment: (NOTE) SARS-CoV-2 target nucleic acids are NOT DETECTED.  The SARS-CoV-2  RNA is generally detectable in upper respiratory specimens during the acute phase of infection. The lowest concentration of SARS-CoV-2 viral copies this assay can detect is 138 copies/mL. A negative result does not preclude SARS-Cov-2 infection and should not be used as the sole basis for treatment or other patient management decisions. A negative result may occur with  improper specimen collection/handling, submission of specimen other than nasopharyngeal swab, presence of viral mutation(s) within the areas targeted by this assay, and inadequate number of viral copies(<138 copies/mL). A negative result must be combined with clinical observations, patient history, and epidemiological information. The expected result is Negative.  Fact Sheet for Patients:  EntrepreneurPulse.com.au  Fact Sheet for Healthcare Providers:  IncredibleEmployment.be  This test is no t yet approved or cleared by the [name] FDA and  has been authorized for detection and/or diagnosis of SARS-CoV-2 by FDA under an Emergency Use Authorization  (EUA). This EUA will remain  in effect (meaning this test can be used) for the duration of the COVID-19 declaration under Section 564(b)(1) of the Act, 21 U.S.C.section 360bbb-3(b)(1), unless the authorization is terminated  or revoked sooner.       Influenza A by PCR NEGATIVE NEGATIVE Final   Influenza B by PCR NEGATIVE NEGATIVE Final    Comment: (NOTE) The Xpert Xpress SARS-CoV-2/FLU/RSV plus assay is intended as an aid in the diagnosis of influenza from Nasopharyngeal swab specimens and should not be used as a sole basis for treatment. Nasal washings and aspirates are unacceptable for Xpert Xpress SARS-CoV-2/FLU/RSV testing.  Fact Sheet for Patients: EntrepreneurPulse.com.au  Fact Sheet for Healthcare Providers: IncredibleEmployment.be  This test is not yet approved or cleared by the [name] FDA and has been authorized for detection and/or diagnosis of SARS-CoV-2 by FDA under an Emergency Use Authorization (EUA). This EUA will remain in effect (meaning this test can be used) for the duration of the COVID-19 declaration under Section 564(b)(1) of the Act, 21 U.S.C. section 360bbb-3(b)(1), unless the authorization is terminated or revoked.  Performed at [facility], [place] [address] [place], [place] [address]          Radiology Studies: No results found.      Scheduled Meds:  apixaban  10 mg Oral BID   Followed by   Derrill Memo ON 02/22/2021] apixaban  5 mg Oral BID   dexamethasone  2 mg Oral Q8H   docusate sodium  100 mg Oral BID   feeding supplement  237 mL Oral BID BM   lipase/protease/amylase  12,000 Units Oral TID AC   multivitamin with minerals  1 tablet Oral Daily   oxyCODONE  10 mg Oral Q12H   polyethylene glycol  17 g Oral BID   sertraline  25 mg Oral Daily   sodium chloride flush  3 mL Intravenous Q12H   Continuous Infusions:  sodium chloride 100 mL/hr at 02/15/21 0659     LOS: 5 days    Time  spent: 35 minutes    Libertie Hausler A Dorr Perrot, MD [facility]   If 7PM-7AM, please contact night-coverage [email or web address]  02/16/2021, 10:31 AM

## 2021-02-16 NOTE — Progress Notes (Addendum)
Palliative Medicine Inpatient Follow Up Note  HPI:  Per intake H&P --> Angie Rojas, 76 y.o. female with PMH of anxiety at baseline, recent left femoral vein DVT about a wk PTA and placed on Eliquis by PCP presented to ED with complaint of abdominal pain. 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    Palliative care has been asked to get involved to aid in goals of care conversations and symptom management.   Today's Discussion (02/16/2021):  *Please note that this is a verbal dictation therefore any spelling or grammatical errors are due to the "[place] One" system interpretation.  Chart reviewed inclusive of progress notes, laboratory results, and diagnostic images.   I had corresponded with the evening nurse and confirmed that Angie Rojas had a more restful night in far less pain than on the prior night.  I met with [name] and her son, [name] at bedside early this morning.  Angie Rojas shares with me that she was able to sleep all throughout the night.  She endorses that she has had much better control of pain and is at a lower level certainly as compared to yesterday.    She is now able to mobilize back and forth to the bathroom without feeling breathless.    Reviewed the plan to continue the present regimen and evaluate daily in terms of ongoing pain management needs.    Discussed patient's anxiety and how initiating her on the sertraline should start to help her within the next few weeks though in the interim we will continue the Xanax as needed.    [name]'s largest symptom complaint today is that she has not had a bowel movement in many days.  We reviewed the importance of initiating a bowel stimulant to help aid in this effort.    I shared the importance of mobility to maintain strength as well as nutrition.  [name] is open to meeting with the nutritionist later today. _____________________________ Addendum:  I met in the late morning/early afternoon to talk  with Angie Rojas and both of her sons who are now present [name].  We reviewed the plan presently in terms of symptom management.  Angie Rojas I were able to mobilize to the restroom where she shares she has some burning with urination.  I shared I would update the primary team about this for urine analysis.  Otherwise she is mobilizing well and endorse that she does not feel dizzy or that she is leaning back as much which I agreed with.  She felt well enough to walk in the hall and we were able to mobilize over 300 feet together.  Angie Rojas was standby assistance.  Angie Rojas has met with the nutrition team and has an idea of what to eat moving forward in terms of protein.  A comprehensive discussion of what to expect in terms of outpatient symptom management follow-up was completed.  [name] and her sons would like her to get both outpatient palliative support at the oncology clinic as well as support in the home.  Discussed obtaining advanced directives.  Angie Rojas shares that her lawyer has these and she will work on getting them emailed to the palliative care team.  At this time Angie Rojas shares she is not ready to change her CODE STATUS until she understands the staging of her cancer and further talks to oncology.  She also emphasizes a strong desire to "fight".  I shared that this is an ongoing dialogue and there is no need to  feel pressured into any decision given all of the information she is recently acquired.  Created space and opportunity for patient to explore thoughts feelings and fears regarding current medical situation.  Questions and concerns addressed   Palliative Support Provided.   Objective Assessment: Vital Signs Vitals:   02/16/21 0400 02/16/21 0741  BP: 115/68 98/75  Pulse: 82 91  Resp: 16 16  Temp: 98.5 F (36.9 C) 98.4 F (36.9 C)  SpO2: 97% 95%    Intake/Output Summary (Last 24 hours) at 02/16/2021 1209 Last data filed at 02/16/2021 3614 Gross per 24 hour  Intake  1389.82 ml  Output --  Net 1389.82 ml   Last Weight  Most recent update: 02/15/2021  5:29 AM    Weight  60.2 kg (132 lb 11.5 oz)            Gen:  NAD HEENT: moist mucous membranes CV: Regular rate and rhythm PULM: clear to auscultation bilaterally ABD: soft/nontender EXT: No edema Neuro: Alert and oriented x3  SUMMARY OF RECOMMENDATIONS   Full Code   FU final pathology results  Let primary team know about burning with urination  A copy of patient's advanced directives will be obtained and emailed to the palliative care team   Referral to Outpatient Palliative Care through the [place], NP and Gabriel Rung, RN messaged  TOC - Also OP Palliative support   Ongoing support during this difficult time   Code Status/Advance Care Planning: FULL CODE   Symptom Management:  Metastatic Pain - - DC Percocet - Oxycodone 7.5-$RemoveBeforeD'15mg'WOswgKYJaDBzVc$  PO Q6H PRN moderate-severe pain - Low dose long acting OxyContin $RemoveBeforeDE'10mg'gyOaYCcENIAgHPm$  PO BID - Dilaudid 0.5-$RemoveBefore'1mg'aPaoSsCcfIphL$  IV Q3H PRN (needed none in last 24hrs) - Decadron $RemoveBe'2mg'ZeHAxCBPI$  PO TID for three day course to help capsular pain (Started 1/22 - )   Anxiety -  - Sertraline $RemoveBefo'25mg'nRnGMhnvWie$  QDay - Initiated on Xanax 0.$RemoveBe'25mg'KVtTNLODd$  PO TID PRN   Failure to thrive -  - Nutrition consult - PO's have been good in the hospital - Pancrealipase AC   Mobility -  - PT/OT consults   Constipation: - Patient shares no BM in > 1 week - Bisacodyl Oral this AM - Miralax 17g BID   Nausea: - Zofran Q6H PRN  Fatigue -  - Pace yourself - Plan your day - Include naps and breaks - Schedule a relaxing day - Get a little exercise - Fuel the body - Consider complementary therapies - Deep breathing - Prayer/medication  - Guided meditation  Time Spent: 120  MDM - High  Medical Decision Making:4 #/Complex Problems: 4                     Data Reviewed:    4             Management: 4 (1-Straightforward, 2-Low, 3-Moderate,  4-High) ______________________________________________________________________________________ Tacey Ruiz Obetz Palliative Medicine Team Team Cell Phone: [phone] Please utilize secure chat with additional questions, if there is no response within 30 minutes please call the above phone number  Palliative Medicine Team providers are available by phone from 7am to 7pm daily and can be reached through the team cell phone.  Should this patient require assistance outside of these hours, please call the patient's attending physician.

## 2021-02-16 NOTE — TOC Initial Note (Addendum)
Transition of Care [facility]) - Initial/Assessment Note    Patient Details  Name: Angie Rojas MRN: [id number] Date of Birth: 07-Jan-1946  [facility]) CM/SW Contact:    Marilu Favre, RN Phone Number: 02/16/2021, 10:19 AM  Clinical Narrative:                  Spoke to patient and son at bedside. Confirmed face sheet information. Patient from home alone but she has 2 sons who are going to stay with her at discharge.   Discussed HHPT patient in agreement. Arranged with Alvis Lemmings.   Patient in agreement for Rollator same ordered with [place].   1310 Referral for OP Palliative Care to [place], they will follow and call patient after discharge   Expected Discharge Plan: [place] Barriers to Discharge: Continued Medical Work up   Patient Goals and CMS Choice Patient states their goals for this hospitalization and ongoing recovery are:: to return to home CMS Medicare.gov Compare Post Acute Care list provided to:: Patient Choice offered to / list presented to : Patient  Expected Discharge Plan and Services Expected Discharge Plan: [place] Choice: Home Health, Durable Medical Equipment Living arrangements for the past 2 months: Single Family Home                 DME Arranged: Walker rolling with seat (rollator) DME Agency: [employer] Date DME Agency Contacted: 02/16/21 Time DME Agency Contacted: 1275 Representative spoke with at DME Agency: Freda Munro HH Arranged: PT [place]: [place] Date [place]: 02/16/21 Time [place]: 48 Representative spoke with at [place]  Prior Living Arrangements/Services Living arrangements for the past 2 months: Single Family Home Lives with:: Self Patient language and need for interpreter reviewed:: Yes Do you feel safe going back to the place where you live?: Yes      Need for Family Participation in Patient  Care: Yes (Comment) Care giver support system in place?: Yes (comment)   Criminal Activity/Legal Involvement Pertinent to Current Situation/Hospitalization: No - Comment as needed  Activities of Daily Living      Permission Sought/Granted   Permission granted to share information with : Yes, Verbal Permission Granted  Share Information with NAME: son at bedside           Emotional Assessment Appearance:: Appears stated age Attitude/Demeanor/Rapport: Engaged Affect (typically observed): Accepting Orientation: : Oriented to Self, Oriented to Place, Oriented to  Time, Oriented to Situation Alcohol / Substance Use: Not Applicable Psych Involvement: No (comment)  Admission diagnosis:  Metastatic cancer ([place]) [C79.9] Metastases to the liver [facility]) [C78.7] Liver mass [R16.0] Pancreatic mass [K86.89] Abdominal pain, unspecified abdominal location [R10.9] Other acute pulmonary embolism without acute cor pulmonale (HCC) [I26.99] Patient Active Problem List   Diagnosis Date Noted   Metastatic cancer ([place]) 02/11/2021   Right pulmonary embolus ([place]) 02/11/2021   Left femoral vein DVT ([place]) 02/11/2021   PCP:  Lajean Manes, MD Pharmacy:   [facility] [phone] [address], [address] AT [facility] [address] [place] [address] Phone: [phone] Fax: [phone]     Social Determinants of Health (SDOH) Interventions    Readmission Risk Interventions No flowsheet data found.

## 2021-02-16 NOTE — Discharge Instructions (Addendum)
Information on my medicine - ELIQUIS (apixaban)  This medication education was reviewed with me or my healthcare representative as part of my discharge preparation.    Why was Eliquis prescribed for you? Eliquis was prescribed to treat blood clots that may have been found in the veins of your legs (deep vein thrombosis) or in your lungs (pulmonary embolism) and to reduce the risk of them occurring again.  What do You need to know about Eliquis ? The starting dose is 10 mg (two 5 mg tablets) taken TWICE daily for the FIRST SEVEN (7) DAYS, then on Sunday 02/22/21  the dose is reduced to ONE 5 mg tablet taken TWICE daily.  Eliquis may be taken with or without food.   Try to take the dose about the same time in the morning and in the evening. If you have difficulty swallowing the tablet whole please discuss with your pharmacist how to take the medication safely.  Take Eliquis exactly as prescribed and DO NOT stop taking Eliquis without talking to the doctor who prescribed the medication.  Stopping may increase your risk of developing a new blood clot.  Refill your prescription before you run out.  After discharge, you should have regular check-up appointments with your healthcare provider that is prescribing your Eliquis.    What do you do if you miss a dose? If a dose of ELIQUIS is not taken at the scheduled time, take it as soon as possible on the same day and twice-daily administration should be resumed. The dose should not be doubled to make up for a missed dose.  Important Safety Information A possible side effect of Eliquis is bleeding. You should call your healthcare provider right away if you experience any of the following: Bleeding from an injury or your nose that does not stop. Unusual colored urine (red or dark brown) or unusual colored stools (red or black). Unusual bruising for unknown reasons. A serious fall or if you hit your head (even if there is no bleeding).  Some  medicines may interact with Eliquis and might increase your risk of bleeding or clotting while on Eliquis. To help avoid this, consult your healthcare provider or pharmacist prior to using any new prescription or non-prescription medications, including herbals, vitamins, non-steroidal anti-inflammatory drugs (NSAIDs) and supplements.  This website has more information on Eliquis (apixaban): http://[email or web address].com/eliquis/home

## 2021-02-17 ENCOUNTER — Other Ambulatory Visit (HOSPITAL_COMMUNITY): Payer: Self-pay

## 2021-02-17 DIAGNOSIS — G893 Neoplasm related pain (acute) (chronic): Secondary | ICD-10-CM | POA: Diagnosis not present

## 2021-02-17 DIAGNOSIS — R531 Weakness: Secondary | ICD-10-CM

## 2021-02-17 DIAGNOSIS — Z515 Encounter for palliative care: Secondary | ICD-10-CM

## 2021-02-17 DIAGNOSIS — C799 Secondary malignant neoplasm of unspecified site: Secondary | ICD-10-CM | POA: Diagnosis not present

## 2021-02-17 LAB — BASIC METABOLIC PANEL
Anion gap: 8 (ref 5–15)
BUN: 9 mg/dL (ref 8–23)
CO2: 27 mmol/L (ref 22–32)
Calcium: 8.8 mg/dL — ABNORMAL LOW (ref 8.9–10.3)
Chloride: 101 mmol/L (ref 98–111)
Creatinine, Ser: 0.55 mg/dL (ref 0.44–1.00)
GFR, Estimated: >60 mL/min (ref >60)
Glucose, Bld: 141 mg/dL — ABNORMAL HIGH (ref 70–99)
Potassium: 4.2 mmol/L (ref 3.5–5.1)
Sodium: 136 mmol/L (ref 135–145)

## 2021-02-17 LAB — CBC
HCT: 27.8 % — ABNORMAL LOW (ref 36.0–46.0)
Hemoglobin: 8.6 g/dL — ABNORMAL LOW (ref 12.0–15.0)
MCH: 27.6 pg (ref 26.0–34.0)
MCHC: 30.9 g/dL (ref 30.0–36.0)
MCV: 89.1 fL (ref 80.0–100.0)
Platelets: 508 10*3/uL — ABNORMAL HIGH (ref 150–400)
RBC: 3.12 MIL/uL — ABNORMAL LOW (ref 3.87–5.11)
RDW: 14 % (ref 11.5–15.5)
WBC: 20.8 10*3/uL — ABNORMAL HIGH (ref 4.0–10.5)
nRBC: 0 % (ref 0.0–0.2)

## 2021-02-17 MED ORDER — OXYCODONE HCL 5 MG PO TABS: 20.0000 mg | ORAL_TABLET | Freq: Four times a day (QID) | ORAL | Status: DC | PRN

## 2021-02-17 MED ORDER — OXYCODONE HCL 5 MG PO TABS
10.0000 mg | ORAL_TABLET | ORAL | Status: DC | PRN
  Administered 2021-02-17 – 2021-02-18 (×4): 10 mg via ORAL
  Filled 2021-02-17 (×4): qty 2

## 2021-02-17 MED ORDER — OXYCODONE HCL 5 MG PO TABS: 5.0000 mg | ORAL_TABLET | ORAL | Status: DC | PRN

## 2021-02-17 MED ORDER — OXYCODONE HCL ER 15 MG PO T12A
15.0000 mg | EXTENDED_RELEASE_TABLET | Freq: Two times a day (BID) | ORAL | Status: DC
  Administered 2021-02-17 – 2021-02-18 (×2): 15 mg via ORAL
  Filled 2021-02-17 (×2): qty 1

## 2021-02-17 MED ORDER — OXYCODONE HCL 5 MG PO TABS: 15.0000 mg | ORAL_TABLET | Freq: Four times a day (QID) | ORAL | Status: DC | PRN

## 2021-02-17 MED ORDER — OXYCODONE HCL 5 MG PO TABS: 15.0000 mg | ORAL_TABLET | ORAL | Status: DC | PRN

## 2021-02-17 MED ORDER — OXYCODONE HCL 5 MG PO TABS: 5.0000 mg | ORAL_TABLET | Freq: Four times a day (QID) | ORAL | Status: DC | PRN

## 2021-02-17 MED ORDER — SENNA 8.6 MG PO TABS
1.0000 | ORAL_TABLET | Freq: Every day | ORAL | Status: DC
  Administered 2021-02-17 – 2021-02-18 (×2): 8.6 mg via ORAL
  Filled 2021-02-17 (×2): qty 1

## 2021-02-17 MED ORDER — BISACODYL 10 MG RE SUPP
10.0000 mg | Freq: Once | RECTAL | Status: AC
  Administered 2021-02-17: 13:00:00 10 mg via RECTAL
  Filled 2021-02-17: qty 1

## 2021-02-17 MED ORDER — ALPRAZOLAM 0.5 MG PO TABS
0.5000 mg | ORAL_TABLET | Freq: Three times a day (TID) | ORAL | Status: DC | PRN
  Administered 2021-02-17 – 2021-02-18 (×2): 0.5 mg via ORAL
  Filled 2021-02-17 (×2): qty 1

## 2021-02-17 NOTE — Plan of Care (Signed)
°  Problem: Education: Goal: Knowledge of General Education information will improve Description: Including pain rating scale, medication(s)/side effects and non-pharmacologic comfort measures Outcome: Progressing   Problem: Health Behavior/Discharge Planning: Goal: Ability to manage health-related needs will improve Outcome: Progressing   Problem: Clinical Measurements: Goal: Ability to maintain clinical measurements within normal limits will improve Outcome: Progressing   Problem: Nutrition: Goal: Adequate nutrition will be maintained Outcome: Progressing   Problem: Elimination: Goal: Will not experience complications related to bowel motility Outcome: Progressing Goal: Will not experience complications related to urinary retention Outcome: Progressing

## 2021-02-17 NOTE — Progress Notes (Signed)
° °  This pt has been referred to our [facility] Connection program. This is a home based Palliative care program that is provided by [facility]. We will follow the pt at home after discharge to assist with any chronic/acute sx management needs. We utilize her PCP as the attending for this program. She will be getting nursing and SW support in the home with this services. She will also have after hours nursing support as well.   She is eligible for other [facility] services in home with our program in place as well.   Thank you for this referral.  Webb Silversmith RN [phone]

## 2021-02-17 NOTE — Progress Notes (Signed)
PROGRESS NOTE    Angie Rojas  OHY:[id number] DOB: [birth year] DOA: 02/11/2021 PCP: Lajean Manes, MD   Brief Narrative: 76 year old past medical history significant for anxiety, recent left femoral vein DVT about a week prior to admission placed on Eliquis by PCP presents to the ED complaining of abdominal pain.  She has had abdominal pain for 3 to 4 weeks intermittent and constant radiation to the back 10 out of 10 in severity without improvement with medications.  She also has had intermittent shortness of breath and chest pain.  Evaluation in the ED she was tachycardic, chest x-ray was negative.  CT angio chest show acute PE segmental and subsegmental branches of the right lower lobe, dilation of the right ventricular cavity suggesting possible right ventricular strain.  No aortic dissection, CT abdomen showed diffuse hepatic metastatic disease with pancreatic tail as well as pancreatic body lesion, likely primary neoplasm.  Borderline retroperitoneal lymph nodes.  Fibroid.  Patient was given IV pain medication and placed on heparin drip and admitted for further work-up.  She underwent ultrasound core biopsy of the liver on 1/20 by IR.   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 -Appreciate  palliative helping with management of pain. -She was having a lot pain this am, asked for pain medication and it took a  long time to get meds. Palliative increase oxycodone to 20 mg Q 6 hours PRN.   2-Right lower lobe PE, recent left lower leg DVT This was not considered Eliquis failure, she probably had PE the same time she had diagnosis of the  DVT -She was initially on heparin drip. Transition to Eliquis 1/22. --RV function normal by ECHO.  Tolerating Eliquis.   3-Leukocytosis: Reactive, in the setting of malignancy.  No evidence of infection. She is now on Decadron, which could cause leukocytosis.  Worsening leukocytosis in setting of Steroids.   Anemia: Suspect anemia of chronic disease.  Continue to monitor stable  Anxiety: Continue with Xanax 3 times daily  Transaminases: Likely from liver mets  Dysuria; UA negative for infection. Resolved.  Constipation; started on bowel regimen. Will try suppository   Nutrition Problem: Severe Malnutrition (in the context of chronic illness) Etiology: poor appetite, nausea    Signs/Symptoms: severe muscle depletion, severe fat depletion    Interventions: Refer to RD note for recommendations  Estimated body mass index is 25.92 kg/m as calculated from the following:   Height as of this encounter: 5' (1.524 m).   Weight as of this encounter: 60.2 kg.   DVT prophylaxis:  Code Status:  Family Communication: Disposition Plan:  Status is: Inpatient  Remains inpatient appropriate because: pain management. Hopefully home soon when pain is controlled.         Consultants:  IR Oncology  Palliative,   Procedures:  Liver Bx  Antimicrobials:    Subjective: She only received one dose of Oxycodone yesterday, she relates she ask for pain meds yesterday.  No BM yet. Will try suppository   Objective: Vitals:   02/16/21 1602 02/16/21 2022 02/17/21 0456 02/17/21 0806  BP: 117/75 91/69 127/74 132/71  Pulse: 94 96 87 93  Resp: 16 18 18 17   Temp: 98 F (  36.7 C) 98.1 F (36.7 C) 98.3 F (36.8 C) 98.2 F (36.8 C)  TempSrc: Oral Oral Oral   SpO2: 93% 97% 91% 91%  Weight:      Height:        Intake/Output Summary (Last 24 hours) at 02/17/2021 1233 Last data filed at 02/17/2021 0848 Gross per 24 hour  Intake 240 ml  Output --  Net 240 ml    Filed Weights    02/11/21 1603 02/14/21 0500 02/15/21 0500  Weight: 54.4 kg 55.6 kg 60.2 kg    Examination:  General exam: NAD Respiratory system: CTA Cardiovascular system: S 1,. S 2 RRR Gastrointestinal system: BS present, soft, nt Central nervous system: Alert.  Extremities: no edema    Data Reviewed: I have personally reviewed following labs and imaging studies  CBC: Recent Labs  Lab 02/11/21 1324 02/12/21 0326 02/13/21 0143 02/14/21 0340 02/15/21 0144 02/16/21 0147 02/17/21 0110  WBC 19.4*   < > 15.7* 17.4* 18.7* 16.7* 20.8*  NEUTROABS 15.6*  --   --   --   --   --   --   HGB 9.2*   < > 8.3* 7.8* 8.7* 8.7* 8.6*  HCT 29.5*   < > 26.4* 24.5* 26.7* 27.9* 27.8*  MCV 89.1   < > 89.8 89.7 88.7 89.4 89.1  PLT 542*   < > 424* 371 479* 496* 508*   < > = values in this interval not displayed.    Basic Metabolic Panel: Recent Labs  Lab 02/11/21 1324 02/11/21 1728 02/12/21 0326 02/13/21 0143 02/16/21 0147 02/17/21 0110  NA 135  --  132* 137 135 136  K 3.9  --  3.7 4.1 4.0 4.2  CL 95*  --  103 104 102 101  CO2 23  --  22 26 21* 27  GLUCOSE 116*  --  113* 121* 119* 141*  BUN 11  --  8 5* 5* 9  CREATININE 0.55  --  0.52 0.54 0.51 0.55  CALCIUM 9.7  --  8.0* 8.6* 8.5* 8.8*  MG  --  2.0  --   --   --   --     GFR: Estimated Creatinine Clearance: 50.8 mL/min (by C-G formula based on SCr of 0.55 mg/dL). Liver Function Tests: Recent Labs  Lab 02/11/21 1324 02/12/21 0326  AST 61* 56*  ALT 59* 50*  ALKPHOS 413* 334*  BILITOT 0.9 0.6  PROT 6.8 5.6*  ALBUMIN 2.7* 2.2*    Recent Labs  Lab 02/11/21 1324  LIPASE 32    No results for input(s): AMMONIA in the last 168 hours. Coagulation Profile: No results for input(s): INR, PROTIME in the last 168 hours. Cardiac Enzymes: No results for input(s): CKTOTAL, CKMB, CKMBINDEX, TROPONINI in the last 168 hours. BNP (last 3 results) No results for input(s): PROBNP in the last 8760 hours. HbA1C: No results for input(s): HGBA1C in  the last 72 hours. CBG: No results for input(s): GLUCAP in the last 168 hours. Lipid Profile: No results for input(s): CHOL, HDL, LDLCALC, TRIG, CHOLHDL, LDLDIRECT in the last 72 hours. Thyroid Function Tests: No results for input(s): TSH, T4TOTAL, FREET4, T3FREE, THYROIDAB in the last 72 hours. Anemia Panel: No results for input(s): VITAMINB12, FOLATE, FERRITIN, TIBC, IRON, RETICCTPCT in the last 72 hours. Sepsis Labs: No results for input(s): PROCALCITON, LATICACIDVEN in the last 168 hours.  Recent Results (from the past 240 hour(s))  Resp Panel by RT-PCR (Flu A&B, Covid) Nasopharyngeal Swab     Status:  None   Collection Time: 02/11/21  5:01 PM   Specimen: Nasopharyngeal Swab; Nasopharyngeal(NP) swabs in vial transport medium  Result Value Ref Range Status   SARS Coronavirus 2 by RT PCR NEGATIVE NEGATIVE Final    Comment: (NOTE) SARS-CoV-2 target nucleic acids are NOT DETECTED.  The SARS-CoV-2 RNA is generally detectable in upper respiratory specimens during the acute phase of infection. The lowest concentration of SARS-CoV-2 viral copies this assay can detect is 138 copies/mL. A negative result does not preclude SARS-Cov-2 infection and should not be used as the sole basis for treatment or other patient management decisions. A negative result may occur with  improper specimen collection/handling, submission of specimen other than nasopharyngeal swab, presence of viral mutation(s) within the areas targeted by this assay, and inadequate number of viral copies(<138 copies/mL). A negative result must be combined with clinical observations, patient history, and epidemiological information. The expected result is Negative.  Fact Sheet for Patients:  EntrepreneurPulse.com.au  Fact Sheet for Healthcare Providers:  IncredibleEmployment.be  This test is no t yet approved or cleared by the [name] FDA and  has been authorized for detection  and/or diagnosis of SARS-CoV-2 by FDA under an Emergency Use Authorization (EUA). This EUA will remain  in effect (meaning this test can be used) for the duration of the COVID-19 declaration under Section 564(b)(1) of the Act, 21 U.S.C.section 360bbb-3(b)(1), unless the authorization is terminated  or revoked sooner.       Influenza A by PCR NEGATIVE NEGATIVE Final   Influenza B by PCR NEGATIVE NEGATIVE Final    Comment: (NOTE) The Xpert Xpress SARS-CoV-2/FLU/RSV plus assay is intended as an aid in the diagnosis of influenza from Nasopharyngeal swab specimens and should not be used as a sole basis for treatment. Nasal washings and aspirates are unacceptable for Xpert Xpress SARS-CoV-2/FLU/RSV testing.  Fact Sheet for Patients: EntrepreneurPulse.com.au  Fact Sheet for Healthcare Providers: IncredibleEmployment.be  This test is not yet approved or cleared by the [name] FDA and has been authorized for detection and/or diagnosis of SARS-CoV-2 by FDA under an Emergency Use Authorization (EUA). This EUA will remain in effect (meaning this test can be used) for the duration of the COVID-19 declaration under Section 564(b)(1) of the Act, 21 U.S.C. section 360bbb-3(b)(1), unless the authorization is terminated or revoked.  Performed at [facility], [place] [address], [place], [place] [address]           Radiology Studies: No results found.      Scheduled Meds:  apixaban  10 mg Oral BID   Followed by   Derrill Memo ON 02/22/2021] apixaban  5 mg Oral BID   dexamethasone  2 mg Oral Q8H   docusate sodium  100 mg Oral BID   feeding supplement  237 mL Oral TID BM   lipase/protease/amylase  12,000 Units Oral TID AC   multivitamin with minerals  1 tablet Oral Daily   oxyCODONE  10 mg Oral Q12H   polyethylene glycol  17 g Oral BID   senna  1 tablet Oral Daily   sertraline  25 mg Oral Daily   sodium chloride flush  3 mL Intravenous  Q12H   Continuous Infusions:  sodium chloride 50 mL/hr at 02/16/21 1804     LOS: 6 days    Time spent: 35 minutes    Delesha Pohlman A Kwan Shellhammer, MD [facility]   If 7PM-7AM, please contact night-coverage [email or web address]  02/17/2021, 12:33 PM

## 2021-02-17 NOTE — Progress Notes (Signed)
Palliative Medicine Inpatient Follow Up Note  HPI:  Per intake H&P --> Angie Rojas, 76 y.o. female with PMH of anxiety at baseline, recent left femoral vein DVT about a wk PTA and placed on Eliquis by PCP presented to ED with complaint of abdominal pain. 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    Palliative care has been asked to get involved to aid in goals of care conversations and symptom management.   Chart reviewed;  progress notes, laboratory results, and diagnostic images.  Discussed with bedside RN and attending physician.  Today's Discussion (02/17/2021):  I initially met with Angie Rojas, she was verbalizing discomfort and awaiting her pain medication.   Angie tells me she is "asking for her pain medications and is waiting and waiting".  Her pain currently is scaled  10 out 10.  She states  "I need an increase in the dose of my medicines"  I spoke to her bedside RN and patient received her IV Dilaudid  I returned to the bedside 30 minutes later and met with both patient, her son Angie Rojas and his wife Angie Rojas.  Continue conversation regarding patient's current medical situation and treatment plan.  Ultimately patient wishes to discharge home in order to follow-up at [facility], for evaluation and treatment.  Today however she tells me "I need my pain to be better controlled than it is before I can go home"  Education offered on pain management strategies, the importance of taking meds in a timely manner before pain escalates.  I shared with her that I would decrease the intervals in which she have the immediate release medications, and  increase the dose of  Oxycodone ER to  15 mg po every 12 hrs scheduled.  Also will increase alprazolam to 0.5 mg p.o. 3 times daily as needed for anxiety.  She feels that these adjustments will  help.    Detail conversation had with patient and her family regarding the importance of her getting home and controlling  her treatment plan specific as it relates to her pain medication, and most importantly being able to meet with oncologist to move forward with her cancer care.  It appears the patient has an appointment on January 26 at 2:00 with Dr. [name].   Created space and opportunity for patient and her family to explore her thoughts and feelings regarding her current medical situation.  She verbalizes enthusiastically "I plan to fight this thing".  Education and exploration regarding the many shapes and forms that "fight" may take in a  cancer journey.  Discussion had regarding incorporating complimentary practices into her treatment plan, encourage patient to discuss with her oncologist.  She shares how difficult this time has been, being shocked at this diagnosis.  She has always been healthy and active.  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  She  verbalizes "I do not think I have much time", but plans to make the most of it.  I will follow-up in the morning to reevaluate symptom status and medication efficacy.    Questions and concerns addressed     SUMMARY OF RECOMMENDATIONS   Full Code    Referral to Outpatient Palliative Care through the [facility]   TOC - Also OP Palliative support   Ongoing support during this difficult time   Code Status/Advance Care Planning: FULL CODE   Symptom Management:  Metastatic Pain - -  Oxycodone 5 mg  PO Q6H PRN mild pain -Oxycodone 10 mg PO Q6H prn moderate - severe pain  - Increase long acting OxyContin 17m PO BID - Dilaudid 0.5-134mIV Q3H PRN (needed none in last 24hrs) - Decadron 42m33mO TID for three day course to help capsular pain (Started 1/22 - )   Anxiety -  - Continue Sertraline 10m59may - Increase Xanax 0.5 mg PO TID PRN    Mobility -  - PT/OT consults   Constipation: - Patient shares no BM in > 1 week - Bisacodyl Oral this AM - Miralax 17g BID   Nausea: -  Zofran Q6H PRN   ______________________________________________________________________________________ MaryWadie Lessen [facility] Team Cell Phone: [phone] Medicine Team providers are available by phone from 7am to 7pm daily and can be reached through the team cell phone.  Should this patient require assistance outside of these hours, please call the patient's attending physician.

## 2021-02-17 NOTE — Progress Notes (Signed)
Physical Therapy Treatment Patient Details Name: Angie Rojas MRN: [id number] DOB: Dec 22, 1945 Today's Date: 02/17/2021   History of Present Illness Angie Rojas, 76 y.o. 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 Pt needing up to min guard for stair with single handrail and up to minA for gait without AD, pt resistant to using rollator during session but agreeable to recommendation for taking one home and states she will use it "when I need it". Emphasis on energy conservation (with rollator benefiting this), benefits of mobility and fall risk prevention/safety with transfers, gait and stairs. Pt family present and receptive to all instruction. Pt continues to benefit from PT services to progress toward functional mobility goals.    Recommendations for follow up therapy are one component of a multi-disciplinary discharge planning process, led by the attending physician.  Recommendations may be updated based on patient status, additional functional criteria and insurance authorization.  Follow Up Recommendations  Home health PT     Assistance Recommended at Discharge Intermittent Supervision/Assistance  Patient can return home with the following A little help with walking and/or transfers;Assistance with cooking/housework;Help with stairs or ramp for entrance;Assist for transportation   Equipment Recommendations  Rollator (4 wheels);Other (comment)    Recommendations for Other Services        Precautions / Restrictions Precautions Precautions: Fall Precaution Comments: refusing AD although she would benefit Restrictions Weight Bearing Restrictions: No     Mobility  Bed Mobility                    Transfers Overall transfer level: Needs assistance Equipment used: None Transfers: Sit to/from Stand Sit to Stand: Supervision           General transfer comment: pt refusing AD, but does reach out for furniture while walking frequently, especially in room/narrow spaces    Ambulation/Gait Ambulation/Gait assistance: Min guard, Min assist Gait Distance (Feet): 125 Feet Assistive device: None (intermittently uses railing) Gait Pattern/deviations: Decreased step length - right, Decreased step length - left       General Gait Details: 1 episode minA walking into stair area needing gait belt support otherwise min guard for safety, pt mostly not using rail in hallway but occasionally veering toward rail; pt adamant about not wanting to use rollator "until I need it" despite instruction on benefits including energy conservation.   Stairs Stairs: Yes Stairs assistance: Min guard Stair Management: One rail Right, Alternating pattern, Step to pattern, Forwards Number of Stairs: 3 General stair comments: pt alternating stepping up and step-to for downward pattern, needs sequencing cues each step; pt not wanting HHA and fairly steady using single rail when sequencing properly; less steady when she ignores cues and stepping down with "good" leg   Wheelchair Mobility    Modified Rankin (Stroke Patients Only)       Balance Overall balance assessment: Needs assistance   Sitting balance-Leahy Scale: Good     Standing balance support: During functional activity Standing balance-Leahy Scale: Fair  Standing balance comment: reaches for UE support frequently                            Cognition Arousal/Alertness: Awake/alert Behavior During  Therapy: WFL for tasks assessed/performed, Impulsive Overall Cognitive Status: No family/caregiver present to determine baseline cognitive functioning                                 General Comments: requires frequent redirection to task, and repetition of questions/instructions; pt very tangential and self-directed within session        Exercises      General Comments General comments (skin integrity, edema, etc.): VSS per chart review, no acute s/sx distress      Pertinent Vitals/Pain Pain Assessment Pain Assessment: Faces Faces Pain Scale: Hurts little more Pain Location: back and r side Pain Descriptors / Indicators: Discomfort, Grimacing, Guarding Pain Intervention(s): Monitored during session, Repositioned    Home Living                          Prior Function            PT Goals (current goals can now be found in the care plan section) Acute Rehab PT Goals Patient Stated Goal: Get pain under control PT Goal Formulation: With patient Time For Goal Achievement: 03/01/21 Progress towards PT goals: Progressing toward goals    Frequency    Min 3X/week      PT Plan Current plan remains appropriate    Co-evaluation              AM-PAC PT "6 Clicks" Mobility   Outcome Measure  Help needed turning from your back to your side while in a flat bed without using bedrails?: None Help needed moving from lying on your back to sitting on the side of a flat bed without using bedrails?: None Help needed moving to and from a bed to a chair (including a wheelchair)?: A Little Help needed standing up from a chair using your arms (e.g., wheelchair or bedside chair)?: A Little Help needed to walk in hospital room?: A Little Help needed climbing 3-5 steps with a railing? : A Little 6 Click Score: 20    End of Session Equipment Utilized During Treatment: Gait belt Activity Tolerance: Patient tolerated treatment well Patient left: in  chair;with call bell/phone within reach;with family/visitor present Nurse Communication: Mobility status PT Visit Diagnosis: Unsteadiness on feet (R26.81)     Time: 4034-7425 PT Time Calculation (min) (ACUTE ONLY): 27 min  Charges:  $Gait Training: 8-22 mins $Therapeutic Activity: 8-22 mins                     Keyleigh Manninen P., PTA Acute Rehabilitation Services Pager: [phone] Office: [place] 02/17/2021, 6:15 PM

## 2021-02-17 NOTE — Progress Notes (Signed)
Mobility Specialist Progress Note:   02/17/21 1300  Mobility  Activity Ambulated with assistance in hallway  Level of Assistance Contact guard assist, steadying assist  Assistive Device None  Distance Ambulated (ft) 1020 ft  Activity Response Tolerated well  $Mobility charge 1 Mobility   Pt eager for OOB mobility today. Adamantly declining the use of an AD this session. Pt grabbing walls/rails in the hallways throughout session. Would benefit from use of AD. Left pt back in bed with RN in room.   Nelta Numbers Mobility Specialist  Phone [phone]

## 2021-02-18 ENCOUNTER — Encounter (HOSPITAL_COMMUNITY): Payer: Self-pay | Admitting: *Deleted

## 2021-02-18 ENCOUNTER — Other Ambulatory Visit: Payer: Self-pay | Admitting: Hematology and Oncology

## 2021-02-18 ENCOUNTER — Telehealth: Payer: Self-pay | Admitting: Hematology and Oncology

## 2021-02-18 DIAGNOSIS — C259 Malignant neoplasm of pancreas, unspecified: Secondary | ICD-10-CM

## 2021-02-18 DIAGNOSIS — K8689 Other specified diseases of pancreas: Secondary | ICD-10-CM | POA: Diagnosis not present

## 2021-02-18 DIAGNOSIS — C799 Secondary malignant neoplasm of unspecified site: Secondary | ICD-10-CM | POA: Diagnosis not present

## 2021-02-18 DIAGNOSIS — G893 Neoplasm related pain (acute) (chronic): Secondary | ICD-10-CM | POA: Diagnosis not present

## 2021-02-18 LAB — SURGICAL PATHOLOGY

## 2021-02-18 MED ORDER — PANCRELIPASE (LIP-PROT-AMYL) 12000-38000 UNITS PO CPEP: 12000.0000 [IU] | ORAL_CAPSULE | Freq: Three times a day (TID) | ORAL | 0 refills | Status: DC

## 2021-02-18 MED ORDER — OXYCODONE HCL 5 MG PO TABS
5.0000 mg | ORAL_TABLET | ORAL | 0 refills | Status: DC | PRN
Start: 2021-02-18 — End: 2022-07-21

## 2021-02-18 MED ORDER — ONDANSETRON HCL 4 MG PO TABS: 4.0000 mg | ORAL_TABLET | Freq: Four times a day (QID) | ORAL | 0 refills | Status: DC | PRN

## 2021-02-18 MED ORDER — DOCUSATE SODIUM 100 MG PO CAPS: 100.0000 mg | ORAL_CAPSULE | Freq: Two times a day (BID) | ORAL | 0 refills | Status: DC

## 2021-02-18 MED ORDER — OXYCODONE HCL ER 15 MG PO T12A
15.0000 mg | EXTENDED_RELEASE_TABLET | Freq: Two times a day (BID) | ORAL | 0 refills | Status: DC
Start: 2021-02-18 — End: 2021-02-25

## 2021-02-18 MED ORDER — SERTRALINE HCL 25 MG PO TABS: 25.0000 mg | ORAL_TABLET | Freq: Every day | ORAL | 1 refills | Status: DC

## 2021-02-18 MED ORDER — APIXABAN 5 MG PO TABS: 10.0000 mg | ORAL_TABLET | Freq: Two times a day (BID) | ORAL | 0 refills | Status: DC

## 2021-02-18 MED ORDER — ALPRAZOLAM 0.5 MG PO TABS: 0.5000 mg | ORAL_TABLET | Freq: Three times a day (TID) | ORAL | 0 refills | Status: DC | PRN

## 2021-02-18 MED ORDER — POLYETHYLENE GLYCOL 3350 17 G PO PACK: 17.0000 g | PACK | Freq: Two times a day (BID) | ORAL | 0 refills | Status: DC

## 2021-02-18 MED ORDER — SENNA 8.6 MG PO TABS: 1.0000 | ORAL_TABLET | Freq: Every day | ORAL | 0 refills | Status: DC

## 2021-02-18 MED ORDER — APIXABAN 5 MG PO TABS: 5.0000 mg | ORAL_TABLET | Freq: Two times a day (BID) | ORAL | 3 refills | Status: DC

## 2021-02-18 NOTE — Discharge Summary (Signed)
Physician Discharge Summary  Angie Rojas WUJ:[id number] DOB: 1945-02-10 DOA: 02/11/2021  PCP: Lajean Manes, MD  Admit date: 02/11/2021 Discharge date: 02/18/2021  Admitted From: Home  Disposition:  Home   Recommendations for Outpatient Follow-up:  Follow up with PCP in 1-2 weeks Please obtain BMP/CBC in one week Needs follow up with Oncology Dr [name] on 1/26 for results of Biopsy.  Continue with palliative care follow up.   Home Health: None  Discharge Condition:Stable.  CODE STATUS: Full code Diet recommendation: Heart Healthy    Brief/Interim Summary: 76 year old past medical history significant for anxiety, recent left femoral vein DVT about a week prior to admission placed on Eliquis by PCP presents to the ED complaining of abdominal pain.  She has had abdominal pain for 3 to 4 weeks intermittent and constant radiation to the back 10 out of 10 in severity without improvement with medications.  She also has had intermittent shortness of breath and chest pain.   Evaluation in the ED she was tachycardic, chest x-ray was negative.  CT angio chest show acute PE segmental and subsegmental branches of the right lower lobe, dilation of the right ventricular cavity suggesting possible right ventricular strain.  No aortic dissection, CT abdomen showed diffuse hepatic metastatic disease with pancreatic tail as well as pancreatic body lesion, likely primary neoplasm.  Borderline retroperitoneal lymph nodes.  Fibroid.  Patient was given IV pain medication and placed on heparin drip and admitted for further work-up.   She underwent ultrasound core biopsy of the liver on 1/20 by IR.     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 -Appreciate  palliative helping with management of pain. -She was having a lot pain this am, asked for pain  medication and it took a  long time to get meds. -Patient will be discharge on OxyContin 15 BID and Oxycodone IR 10 Mg every 6 hours PR>  Pain controlled. Stable for discharge.   2-Right lower lobe PE, recent left lower leg DVT This was not considered Eliquis failure, she probably had PE the same time she had diagnosis of the DVT -She was initially on heparin drip. Transition to Eliquis 1/22. --RV function normal by ECHO.  Tolerating Eliquis.    3-Leukocytosis: Reactive, in the setting of malignancy.  No evidence of infection. She is now on Decadron, which could cause leukocytosis.  Worsening leukocytosis in setting of Steroids.    Anemia: Suspect anemia of chronic disease.  Continue to monitor stable   Anxiety: Continue with Xanax 3 times daily. Will provide prescriptions.    Transaminases: Likely from liver mets   Dysuria; UA negative for infection. Resolved.  Constipation; started on bowel regimen. Had BM.    Nutrition Problem: Severe Malnutrition (in the context of chronic illness) Etiology: poor appetite, nausea  on supplement.      Signs/Symptoms: severe muscle depletion, severe fat depletion       Interventions: Refer to RD note for recommendations   Estimated body mass index is 25.92 kg/m as calculated from the following:   Height as of this encounter: 5' (1.524 m).   Weight as of this encounter: 60.2 kg.      Discharge Diagnoses:  Principal Problem:   Metastatic cancer [facility]) Active Problems:   Right pulmonary embolus (HCC)   Left femoral vein DVT (HCC)   Protein-calorie malnutrition, severe    Discharge Instructions  Discharge Instructions     Diet - low sodium heart healthy   Complete by: As directed    Increase activity slowly   Complete by: As directed    No wound care   Complete by: As directed       Allergies as of 02/18/2021   No Known Allergies      Medication List     STOP taking these medications    traZODone 50 MG  tablet Commonly known as: DESYREL       TAKE these medications    ALPRAZolam 0.5 MG tablet Commonly known as: XANAX Take 1 tablet (0.5 mg total) by mouth 3 (three) times daily as needed for anxiety.   apixaban 5 MG Tabs tablet Commonly known as: ELIQUIS Take 2 tablets (10 mg total) by mouth 2 (two) times daily for 3 days. What changed: how much to take   apixaban 5 MG Tabs tablet Commonly known as: ELIQUIS Take 1 tablet (5 mg total) by mouth 2 (two) times daily. Start taking on: February 22, 2021 What changed: You were already taking a medication with the same name, and this prescription was added. Make sure you understand how and when to take each.   docusate sodium 100 MG capsule Commonly known as: COLACE Take 1 capsule (100 mg total) by mouth 2 (two) times daily.   lipase/protease/amylase 12000-38000 units Cpep capsule Commonly known as: CREON Take 1 capsule (12,000 Units total) by mouth 3 (three) times daily before meals.   multivitamin with minerals Tabs tablet Take 1 tablet by mouth daily.   ondansetron 4 MG tablet Commonly known as: ZOFRAN Take 1 tablet (4 mg total) by mouth every 6 (six) hours as needed for nausea.   oxyCODONE 15 mg 12 hr tablet Commonly known as: OXYCONTIN Take 1 tablet (15 mg total) by mouth every 12 (twelve) hours.   oxyCODONE 5 MG immediate release tablet Commonly known as: Oxy IR/ROXICODONE Take 1-2 tablets (5-10 mg total) by mouth every 4 (four) hours as needed (mild pain).   polyethylene glycol 17 g packet Commonly known as: MIRALAX / GLYCOLAX Take 17 g by mouth 2 (two) times daily.   senna 8.6 MG Tabs tablet Commonly known as: SENOKOT Take 1 tablet (8.6 mg total) by mouth daily. Start taking on: February 19, 2021   sertraline 25 MG tablet Commonly known as: ZOLOFT Take 1 tablet (25 mg total) by mouth daily. Start taking on: February 19, 2021   sodium chloride 0.65 % Soln nasal spray Commonly known as: OCEAN Place 1 spray into  both nostrils 3 (three) times daily.               Durable Medical Equipment  (From admission, onward)           Start     Ordered   02/16/21 1014  For home use only DME 4 wheeled rolling walker with seat  Once       Question:  Patient needs a walker to treat with the following condition  Answer:  Weakness   02/16/21 1013            Follow-up Information     [facility].   Specialty: Home Health Services Contact information: [place] [address] [phone]         Benay Pike, MD Follow up in 1 day(s).   Specialty: Hematology and Oncology Why: Appointment 1;30 PM on 1/26. Contact information: [place]  [place] [address] [phone]                No Known Allergies  Consultations: Oncology palliative   Procedures/Studies: DG Chest 2 View  Result Date: 02/11/2021 CLINICAL DATA:  Shortness of breath and vomiting.  DVT. EXAM: CHEST - 2 VIEW COMPARISON:  None. FINDINGS: Trachea is midline. Heart size normal. Lungs are clear. No pleural fluid. IMPRESSION: No acute findings. Electronically Signed   By: Lorin Picket M.D.   On: 02/11/2021 13:50   CT Head Wo Contrast  Result Date: 02/11/2021 CLINICAL DATA:  Mental status change.  Rule out metastatic disease EXAM: CT HEAD WITHOUT CONTRAST TECHNIQUE: Contiguous axial images were obtained from the base of the skull through the vertex without intravenous contrast. RADIATION DOSE REDUCTION: This exam was performed according to the departmental dose-optimization program which includes automated exposure control, adjustment of the mA and/or kV according to patient size and/or use of iterative reconstruction technique. COMPARISON:  MRI head 07/03/2013 FINDINGS: Brain: No evidence of acute infarction, hemorrhage, hydrocephalus, extra-axial collection or mass lesion/mass effect. Vascular: Negative for hyperdense vessel Skull: Negative Sinuses/Orbits:  Paranasal sinuses clear.  Negative orbit Other: None IMPRESSION: Negative CT head Electronically Signed   By: Franchot Gallo M.D.   On: 02/11/2021 18:07   CT Angio Chest PE W and/or Wo Contrast  Result Date: 02/11/2021 CLINICAL DATA:  Shortness of breath, history of DVT in the left lower extremity EXAM: CT ANGIOGRAPHY CHEST WITH CONTRAST TECHNIQUE: Multidetector CT imaging of the chest was performed using the standard protocol during bolus administration of intravenous contrast. Multiplanar CT image reconstructions and MIPs were obtained to evaluate the vascular anatomy. RADIATION DOSE REDUCTION: This exam was performed according to the departmental dose-optimization program which includes automated exposure control, adjustment of the mA and/or kV according to patient size and/or use of iterative reconstruction technique. CONTRAST:  152mL OMNIPAQUE IOHEXOL 350 MG/ML SOLN COMPARISON:  None. FINDINGS: Cardiovascular: Contrast density in the pulmonary artery branches is less than optimal. There is more contrast enhancement in the systemic vessels suggesting less than optimal timing of the imaging sequence. As far as seen, there are no filling defects in central pulmonary artery branches. There are few intraluminal filling defects in the segmental and subsegmental branches in the right lower lung fields with small thrombus burden. There is no ectasia of main pulmonary artery. Right ventricular cavity is more prominent than usual suggesting possible right ventricular strain. Midportion of right ventricular cavity measures 3.7 cm in diameter. Mediastinum/Nodes: No significant lymphadenopathy seen. Lungs/Pleura: There is no focal pulmonary consolidation. There is no pleural effusion or pneumothorax. Small linear densities in the lower lung fields suggest scarring or subsegmental atelectasis. Upper Abdomen: There are numerous space-occupying lesions of varying sizes in the visualized upper portions of liver measuring up  to 5.3 cm in diameter. There is fatty infiltration in the liver. Musculoskeletal: Unremarkable. Review of the MIP images confirms the above findings. IMPRESSION: Acute pulmonary embolism in few segmental and subsegmental branches in the right lower lobe. Small thrombus burden. There is dilation of right ventricular cavity suggesting possible right ventricular strain. There is no evidence of thoracic aortic dissection. There is no focal pulmonary consolidation. There are numerous space-occupying lesions of varying sizes and varying attenuation in the liver suggesting extensive hepatic metastatic disease. Fatty infiltration is seen in the liver. Electronically Signed   By: Elmer Picker M.D.   On: 02/11/2021 16:02   CT ABDOMEN PELVIS W CONTRAST  Result Date: 02/11/2021 CLINICAL DATA:  Abdominal pain. EXAM: CT ABDOMEN AND PELVIS WITH CONTRAST TECHNIQUE: Multidetector CT imaging of the abdomen and pelvis was performed using the standard protocol following bolus administration of intravenous contrast. RADIATION DOSE REDUCTION: This exam was performed according to the departmental dose-optimization program which includes automated exposure control, adjustment of the mA and/or kV according to patient size and/or use of iterative reconstruction technique. CONTRAST:  120mL OMNIPAQUE IOHEXOL 350 MG/ML SOLN COMPARISON:  None. FINDINGS: Lower chest: Free motion artifact but no pulmonary lesions or pleural effusions. 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 Largest lesion in the left hepatic lobe on image number 18/3 measures 4.4 cm. Index lesion in the right hepatic lobe on image 35/3 measures 4.1 cm. The gallbladder is grossly normal.  No common bile duct dilatation. Pancreas: There is a 2.6 cm lesion in the pancreatic tail which could be the primary neoplasm. There is also a small lesion in the body tail junction region on image 32/3 which measures 10 mm. Spleen: Normal  size.  No splenic lesions. Adrenals/Urinary Tract: No adrenal gland lesions. Small renal cysts. No worrisome renal lesions. The bladder is unremarkable. Stomach/Bowel: The stomach, duodenum, small bowel and terminal ileum are grossly normal. No acute inflammatory process, mass lesions or obstructive findings. No obvious colonic lesions to suggest a colonic primary. The appendix is normal. Vascular/Lymphatic: The aorta and branch vessels are patent. Scattered atherosclerotic calcifications. 2.2 cm the celiac axis lymph node on image 30/3. There are scattered borderline retroperitoneal lymph nodes without overt adenopathy. Nodular mesenteric implant on image 55/3 measures 2.1 cm. Mesenteric implant on image 57/3 measures 18 mm. Reproductive: Calcified right-sided fibroid. There is also a E 3.7 cm mass which could be a partially degenerated fibroid. Could not exclude the possibility of endometrial cancer. Pelvic ultrasound or MRI pelvis may be helpful for further evaluation. No adnexal masses. Prominent gonadal veins bilaterally. Other: Small amount of free pelvic fluid is noted. No inguinal adenopathy. Musculoskeletal: No significant bony findings. IMPRESSION: 1. Diffuse hepatic metastatic disease. 2. 2.6 cm lesion in the pancreatic tail could be the primary neoplasm. However there is a second lesion in the pancreatic body/tail junction region and this could reflect metastatic disease also. 3. Celiac axis lymphadenopathy and mesenteric implants. 4. Borderline retroperitoneal lymph nodes. 5. 3.7 cm mass in the uterus could be a partially degenerated fibroid. Could not exclude the possibility of any medial cancer. PET-CT may be helpful to evaluate all of the above findings and to potentially guide a safe and appropriate biopsy. Electronically Signed   By: Marijo Sanes M.D.   On: 02/11/2021 16:07   US BIOPSY (LIVER)  Result Date: 02/13/2021 INDICATION: Liver metastases, suspect pancreas primary by imaging. EXAM:  ULTRASOUND CORE BIOPSY RIGHT LIVER METASTASIS MEDICATIONS: 1% LIDOCAINE LOCAL ANESTHESIA/SEDATION: Moderate (conscious) sedation was employed during this procedure. A total of Versed 1.0 mg and Fentanyl 25 mcg was administered intravenously by the radiology nurse. Total intra-service moderate Sedation Time: 7 minutes. The patient's level of consciousness and vital signs were monitored continuously by radiology nursing throughout the procedure under my direct supervision. FLUOROSCOPY TIME:  Fluoroscopy Time: None. COMPLICATIONS: None immediate. PROCEDURE: Informed written consent was obtained from the patient after a thorough discussion of the procedural risks, benefits and alternatives. All questions were addressed. Maximal Sterile Barrier Technique was utilized including caps, mask, sterile gowns, sterile gloves, sterile drape, hand hygiene and skin antiseptic. A timeout was performed prior to the initiation of the procedure. Previous imaging reviewed. Preliminary ultrasound  performed. A right hepatic lesion was localized in the mid axillary line through a lower intercostal space. Overlying skin marked. Under sterile conditions and local anesthesia, a 17 gauge coaxial guide needle was advanced into the lesion. Needle position confirmed with ultrasound. 18 gauge core biopsies obtained. Samples placed in formalin. Needle tract occluded with Gel-Foam. Postprocedure imaging demonstrates no hemorrhage or hematoma. Patient tolerated biopsy well. IMPRESSION: Successful ultrasound core biopsy of the right liver metastasis. Electronically Signed   By: Jerilynn Mages.  Shick M.D.   On: 02/13/2021 15:10   US Venous Img Lower Unilateral Left (DVT)  Result Date: 02/03/2021 CLINICAL DATA:  Left lower extremity pain. EXAM: Left LOWER EXTREMITY VENOUS DOPPLER ULTRASOUND TECHNIQUE: Gray-scale sonography with graded compression, as well as color Doppler and duplex ultrasound were performed to evaluate the lower extremity deep venous systems  from the level of the common femoral vein and including the common femoral, femoral, profunda femoral, popliteal and calf veins including the posterior tibial, peroneal and gastrocnemius veins when visible. The superficial great saphenous vein was also interrogated. Spectral Doppler was utilized to evaluate flow at rest and with distal augmentation maneuvers in the common femoral, femoral and popliteal veins. COMPARISON:  January 15, 2021. FINDINGS: Contralateral Common Femoral Vein: Respiratory phasicity is normal and symmetric with the symptomatic side. No evidence of thrombus. Normal compressibility. Common Femoral Vein: No evidence of thrombus. Normal compressibility, respiratory phasicity and response to augmentation. Saphenofemoral Junction: No evidence of thrombus. Normal compressibility and flow on color Doppler imaging. Profunda Femoral Vein: No evidence of thrombus. Normal compressibility and flow on color Doppler imaging. Femoral Vein: Noncompressible with no flow consistent with occlusive thrombus. Popliteal Vein: No evidence of thrombus. Normal compressibility, respiratory phasicity and response to augmentation. Calf Veins: Posterior tibial and peroneal veins are noncompressible consistent with occlusive thrombus. Superficial Great Saphenous Vein: No evidence of thrombus. Normal compressibility. Venous Reflux:  None. Other Findings:  None. IMPRESSION: Occlusive deep venous thrombosis is noted in left femoral, posterior tibial and peroneal veins. Electronically Signed   By: Marijo Conception M.D.   On: 02/03/2021 14:08   ECHOCARDIOGRAM COMPLETE  Result Date: 02/12/2021    ECHOCARDIOGRAM REPORT   Patient Name:   Angie Rojas Date of Exam: 02/12/2021 Medical Rec #:  [id number]         Height:       60.0 in Accession #:    [id number]        Weight:       120.0 lb Date of Birth:  05-26-1945         BSA:          1.502 m Patient Age:    12 years          BP:           103/59 mmHg Patient Gender: F                  HR:           97 bpm. Exam Location:  Inpatient Procedure: 2D Echo Indications:    Pulmonary embolism  History:        Patient has no prior history of Echocardiogram examinations.  Sonographer:    Arlyss Gandy Referring Phys: [id number] RAVI PAHWANI IMPRESSIONS  1. Left ventricular ejection fraction, by estimation, is 60 to 65%. The left ventricle has normal function. The left ventricle has no regional wall motion abnormalities. Left ventricular diastolic parameters were normal.  2. Right ventricular systolic function is normal. The  right ventricular size is normal. There is normal pulmonary artery systolic pressure.  3. The mitral valve is normal in structure. Trivial mitral valve regurgitation. No evidence of mitral stenosis.  4. The aortic valve is tricuspid. Aortic valve regurgitation is not visualized. No aortic stenosis is present.  5. The inferior vena cava is normal in size with greater than 50% respiratory variability, suggesting right atrial pressure of 3 mmHg. Comparison(s): No prior Echocardiogram. FINDINGS  Left Ventricle: Left ventricular ejection fraction, by estimation, is 60 to 65%. The left ventricle has normal function. The left ventricle has no regional wall motion abnormalities. The left ventricular internal cavity size was normal in size. There is  no left ventricular hypertrophy. Left ventricular diastolic parameters were normal. Right Ventricle: The right ventricular size is normal. Right ventricular systolic function is normal. There is normal pulmonary artery systolic pressure. The tricuspid regurgitant velocity is 2.51 m/s, and with an assumed right atrial pressure of 3 mmHg,  the estimated right ventricular systolic pressure is 16.1 mmHg. Left Atrium: Left atrial size was normal in size. Right Atrium: Right atrial size was normal in size. Pericardium: There is no evidence of pericardial effusion. Mitral Valve: The mitral valve is normal in structure. Trivial mitral valve  regurgitation. No evidence of mitral valve stenosis. Tricuspid Valve: The tricuspid valve is normal in structure. Tricuspid valve regurgitation is mild . No evidence of tricuspid stenosis. Aortic Valve: The aortic valve is tricuspid. Aortic valve regurgitation is not visualized. No aortic stenosis is present. Aortic valve mean gradient measures 7.0 mmHg. Aortic valve peak gradient measures 13.8 mmHg. Pulmonic Valve: The pulmonic valve was normal in structure. Pulmonic valve regurgitation is not visualized. No evidence of pulmonic stenosis. Aorta: The aortic root is normal in size and structure. Venous: The inferior vena cava is normal in size with greater than 50% respiratory variability, suggesting right atrial pressure of 3 mmHg. IAS/Shunts: No atrial level shunt detected by color flow Doppler.  LEFT VENTRICLE PLAX 2D LVIDd:         3.80 cm   Diastology LVIDs:         2.70 cm   LV e' medial:    11.00 cm/s LV PW:         0.80 cm   LV E/e' medial:  9.6 LV IVS:        0.80 cm   LV e' lateral:   11.00 cm/s LVOT diam:     1.80 cm   LV E/e' lateral: 9.6 LV SV:         69 LV SV Index:   46 LVOT Area:     2.54 cm  RIGHT VENTRICLE             IVC RV Basal diam:  3.20 cm     IVC diam: 1.50 cm RV Mid diam:    2.50 cm RV S prime:     15.40 cm/s TAPSE (M-mode): 2.1 cm LEFT ATRIUM             Index        RIGHT ATRIUM           Index LA diam:        2.90 cm 1.93 cm/m   RA Area:     11.40 cm LA Vol (A2C):   30.4 ml 20.24 ml/m  RA Volume:   23.70 ml  15.78 ml/m LA Vol (A4C):   50.8 ml 33.81 ml/m LA Biplane Vol: 40.8 ml 27.16 ml/m  AORTIC VALVE  AV Area (Vmax):    1.67 cm AV Area (Vmean):   1.78 cm AV Area (VTI):     1.89 cm AV Vmax:           186.00 cm/s AV Vmean:          120.000 cm/s AV VTI:            0.363 m AV Peak Grad:      13.8 mmHg AV Mean Grad:      7.0 mmHg LVOT Vmax:         122.00 cm/s LVOT Vmean:        83.900 cm/s LVOT VTI:          0.270 m LVOT/AV VTI ratio: 0.74  AORTA Ao Root diam: 2.60 cm Ao Asc diam:   2.60 cm MITRAL VALVE                TRICUSPID VALVE MV Area (PHT): 2.79 cm     TR Peak grad:   25.2 mmHg MV Decel Time: 272 msec     TR Vmax:        251.00 cm/s MV E velocity: 106.00 cm/s MV A velocity: 94.70 cm/s   SHUNTS MV E/A ratio:  1.12         Systemic VTI:  0.27 m                             Systemic Diam: 1.80 cm Kirk Ruths MD Electronically signed by Kirk Ruths MD Signature Date/Time: 02/12/2021/11:40:05 AM    Final    (Echo, Carotid, EGD, Colonoscopy, ERCP)    Subjective: She report pain is better controlled, when she gets meds on time.   Discharge Exam: Vitals:   02/18/21 0340 02/18/21 0721  BP: 125/72 120/81  Pulse: 80 93  Resp: 17 16  Temp: 97.8 F (36.6 C) 98.2 F (36.8 C)  SpO2: 95% 96%     General: Pt is alert, awake, not in acute distress Cardiovascular: RRR, S1/S2 +, no rubs, no gallops Respiratory: CTA bilaterally, no wheezing, no rhonchi Abdominal: Soft, NT, ND, bowel sounds + Extremities: no edema, no cyanosis    The results of significant diagnostics from this hospitalization (including imaging, microbiology, ancillary and laboratory) are listed below for reference.     Microbiology: Recent Results (from the past 240 hour(s))  Resp Panel by RT-PCR (Flu A&B, Covid) Nasopharyngeal Swab     Status: None   Collection Time: 02/11/21  5:01 PM   Specimen: Nasopharyngeal Swab; Nasopharyngeal(NP) swabs in vial transport medium  Result Value Ref Range Status   SARS Coronavirus 2 by RT PCR NEGATIVE NEGATIVE Final    Comment: (NOTE) SARS-CoV-2 target nucleic acids are NOT DETECTED.  The SARS-CoV-2 RNA is generally detectable in upper respiratory specimens during the acute phase of infection. The lowest concentration of SARS-CoV-2 viral copies this assay can detect is 138 copies/mL. A negative result does not preclude SARS-Cov-2 infection and should not be used as the sole basis for treatment or other patient management decisions. A negative result may  occur with  improper specimen collection/handling, submission of specimen other than nasopharyngeal swab, presence of viral mutation(s) within the areas targeted by this assay, and inadequate number of viral copies(<138 copies/mL). A negative result must be combined with clinical observations, patient history, and epidemiological information. The expected result is Negative.  Fact Sheet for Patients:  EntrepreneurPulse.com.au  Fact Sheet for Healthcare Providers:  IncredibleEmployment.be  This test is no t yet approved or cleared by the [place] and  has been authorized for detection and/or diagnosis of SARS-CoV-2 by FDA under an Emergency Use Authorization (EUA). This EUA will remain  in effect (meaning this test can be used) for the duration of the COVID-19 declaration under Section 564(b)(1) of the Act, 21 U.S.C.section 360bbb-3(b)(1), unless the authorization is terminated  or revoked sooner.       Influenza A by PCR NEGATIVE NEGATIVE Final   Influenza B by PCR NEGATIVE NEGATIVE Final    Comment: (NOTE) The Xpert Xpress SARS-CoV-2/FLU/RSV plus assay is intended as an aid in the diagnosis of influenza from Nasopharyngeal swab specimens and should not be used as a sole basis for treatment. Nasal washings and aspirates are unacceptable for Xpert Xpress SARS-CoV-2/FLU/RSV testing.  Fact Sheet for Patients: EntrepreneurPulse.com.au  Fact Sheet for Healthcare Providers: IncredibleEmployment.be  This test is not yet approved or cleared by the [name] FDA and has been authorized for detection and/or diagnosis of SARS-CoV-2 by FDA under an Emergency Use Authorization (EUA). This EUA will remain in effect (meaning this test can be used) for the duration of the COVID-19 declaration under Section 564(b)(1) of the Act, 21 U.S.C. section 360bbb-3(b)(1), unless the authorization is terminated  or revoked.  Performed at [facility], [place] [address] [place], [place] [address]      Labs: BNP (last 3 results) No results for input(s): BNP in the last 8760 hours. Basic Metabolic Panel: Recent Labs  Lab 02/11/21 1728 02/12/21 0326 02/13/21 0143 02/16/21 0147 02/17/21 0110  NA  --  132* 137 135 136  K  --  3.7 4.1 4.0 4.2  CL  --  103 104 102 101  CO2  --  22 26 21* 27  GLUCOSE  --  113* 121* 119* 141*  BUN  --  8 5* 5* 9  CREATININE  --  0.52 0.54 0.51 0.55  CALCIUM  --  8.0* 8.6* 8.5* 8.8*  MG 2.0  --   --   --   --    Liver Function Tests: Recent Labs  Lab 02/12/21 0326  AST 56*  ALT 50*  ALKPHOS 334*  BILITOT 0.6  PROT 5.6*  ALBUMIN 2.2*   No results for input(s): LIPASE, AMYLASE in the last 168 hours.  No results for input(s): AMMONIA in the last 168 hours. CBC: Recent Labs  Lab 02/13/21 0143 02/14/21 0340 02/15/21 0144 02/16/21 0147 02/17/21 0110  WBC 15.7* 17.4* 18.7* 16.7* 20.8*  HGB 8.3* 7.8* 8.7* 8.7* 8.6*  HCT 26.4* 24.5* 26.7* 27.9* 27.8*  MCV 89.8 89.7 88.7 89.4 89.1  PLT 424* 371 479* 496* 508*   Cardiac Enzymes: No results for input(s): CKTOTAL, CKMB, CKMBINDEX, TROPONINI in the last 168 hours. BNP: Invalid input(s): POCBNP CBG: No results for input(s): GLUCAP in the last 168 hours. D-Dimer No results for input(s): DDIMER in the last 72 hours. Hgb A1c No results for input(s): HGBA1C in the last 72 hours. Lipid Profile No results for input(s): CHOL, HDL, LDLCALC, TRIG, CHOLHDL, LDLDIRECT in the last 72 hours. Thyroid function studies No results for input(s): TSH, T4TOTAL, T3FREE, THYROIDAB in the last 72 hours.  Invalid input(s): FREET3 Anemia work up No results for input(s): VITAMINB12, FOLATE, FERRITIN, TIBC, IRON, RETICCTPCT in the last 72 hours. Urinalysis    Component Value Date/Time   COLORURINE AMBER (A) 02/16/2021 1950   APPEARANCEUR HAZY (A) 02/16/2021 1950   LABSPEC 1.016 02/16/2021 1950  PHURINE 6.0  02/16/2021 1950   GLUCOSEU NEGATIVE 02/16/2021 1950   HGBUR NEGATIVE 02/16/2021 1950   BILIRUBINUR NEGATIVE 02/16/2021 1950   KETONESUR NEGATIVE 02/16/2021 1950   PROTEINUR NEGATIVE 02/16/2021 1950   NITRITE NEGATIVE 02/16/2021 1950   LEUKOCYTESUR NEGATIVE 02/16/2021 1950   Sepsis Labs Invalid input(s): PROCALCITONIN,  WBC,  LACTICIDVEN Microbiology Recent Results (from the past 240 hour(s))  Resp Panel by RT-PCR (Flu A&B, Covid) Nasopharyngeal Swab     Status: None   Collection Time: 02/11/21  5:01 PM   Specimen: Nasopharyngeal Swab; Nasopharyngeal(NP) swabs in vial transport medium  Result Value Ref Range Status   SARS Coronavirus 2 by RT PCR NEGATIVE NEGATIVE Final    Comment: (NOTE) SARS-CoV-2 target nucleic acids are NOT DETECTED.  The SARS-CoV-2 RNA is generally detectable in upper respiratory specimens during the acute phase of infection. The lowest concentration of SARS-CoV-2 viral copies this assay can detect is 138 copies/mL. A negative result does not preclude SARS-Cov-2 infection and should not be used as the sole basis for treatment or other patient management decisions. A negative result may occur with  improper specimen collection/handling, submission of specimen other than nasopharyngeal swab, presence of viral mutation(s) within the areas targeted by this assay, and inadequate number of viral copies(<138 copies/mL). A negative result must be combined with clinical observations, patient history, and epidemiological information. The expected result is Negative.  Fact Sheet for Patients:  EntrepreneurPulse.com.au  Fact Sheet for Healthcare Providers:  IncredibleEmployment.be  This test is no t yet approved or cleared by the [name] FDA and  has been authorized for detection and/or diagnosis of SARS-CoV-2 by FDA under an Emergency Use Authorization (EUA). This EUA will remain  in effect (meaning this test can be used)  for the duration of the COVID-19 declaration under Section 564(b)(1) of the Act, 21 U.S.C.section 360bbb-3(b)(1), unless the authorization is terminated  or revoked sooner.       Influenza A by PCR NEGATIVE NEGATIVE Final   Influenza B by PCR NEGATIVE NEGATIVE Final    Comment: (NOTE) The Xpert Xpress SARS-CoV-2/FLU/RSV plus assay is intended as an aid in the diagnosis of influenza from Nasopharyngeal swab specimens and should not be used as a sole basis for treatment. Nasal washings and aspirates are unacceptable for Xpert Xpress SARS-CoV-2/FLU/RSV testing.  Fact Sheet for Patients: EntrepreneurPulse.com.au  Fact Sheet for Healthcare Providers: IncredibleEmployment.be  This test is not yet approved or cleared by the [name] FDA and has been authorized for detection and/or diagnosis of SARS-CoV-2 by FDA under an Emergency Use Authorization (EUA). This EUA will remain in effect (meaning this test can be used) for the duration of the COVID-19 declaration under Section 564(b)(1) of the Act, 21 U.S.C. section 360bbb-3(b)(1), unless the authorization is terminated or revoked.  Performed at [facility], [place] [address] [place], [place] [address]      Time coordinating discharge: 40 minutes  SIGNED:   Elmarie Shiley, MD  [facility]

## 2021-02-18 NOTE — Progress Notes (Signed)
° °  Palliative Medicine Inpatient Follow Up Note  HPI:  Per intake H&P --> Angie Rojas, 76 y.o. female with PMH of anxiety at baseline, recent left femoral vein DVT about a wk PTA and placed on Eliquis by PCP presented to ED with complaint of abdominal pain. 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    Palliative care  involved to aid in goals of care conversations and symptom management.   Chart reviewed;  progress notes, laboratory results, and diagnostic images.  Discussed with bedside RN and attending physician.  Today's Discussion (02/18/2021):  I initial  met with Angie Rojas this morning at bedside.  She is just waking up and tells me the adjustments we made to pain regiment yesterday "seems to be working better".  Continue education regarding patient's current medical situation and treatment plan.  Ultimately patient wishes to discharge home in order to follow-up at [facility], for evaluation and treatment.  She has an appointment tomorrow with Dr. [name]   SUMMARY OF RECOMMENDATIONS   Full Code  Education offered to  patient on the importance of continued conversation with her family and the medical providers regarding overall plan of care and treatment options,  ensuring decisions are within the context of the patients values and GOCs.   Referral to Outpatient Palliative Care through the [facility]  [facility]   TOC - Also OP Palliative support     Symptom Management:  Metastatic Pain - - Oxycodone 5 mg  PO Q6H PRN mild pain -Oxycodone 10 mg PO Q6H prn moderate - severe pain  - Increase long acting OxyContin 15mg  PO BID - Discontinue Dilaudid 0.5-1mg  IV Q3H PRN - Decadron 2mg  PO TID for three day course to help capsular pain (Started 1/22 - )   Anxiety -  - Continue Sertraline 25mg  QDay - Xanax 0.5 mg PO TID PRN     Constipation: - Colace 100 mg p.o. twice daily - Miralax 17g BID -Senokot 1 tablet p.o. daily   Nausea: - Zofran Q6H  PRN   Patient is hopeful for discharge today home.  Family will be staying with her 24/7.   ______________________________________________________________________________________ Wadie Lessen NP  [facility] Team Team Cell Phone: [phone]  Palliative Medicine Team providers are available by phone from 7am to 7pm daily and can be reached through the team cell phone.  Should this patient require assistance outside of these hours, please call the patient's attending physician.

## 2021-02-18 NOTE — Telephone Encounter (Signed)
Scheduled appointment per 01/24 staff message. Patient aware.

## 2021-02-18 NOTE — Progress Notes (Signed)
Discharge instructions given to pt and family. Pt verbalized understanding of all teaching. Pain medication given before discharge.

## 2021-02-18 NOTE — Progress Notes (Signed)
Pt discharged to home with all belongings via wheelchair.

## 2021-02-18 NOTE — Progress Notes (Signed)
Cbc

## 2021-02-18 NOTE — Progress Notes (Unsigned)
Oncology Discharge Planning Note  [facility] Address: [place], [place], [place] [address] Hours of Operation:  Nena Polio, Monday - Friday  Clinic Contact Information:  [phone]  Oncology Care Team: Medical Oncologist:  Dr. [name]  Patient Details: Name:  Angie Rojas, Angie Rojas MRN:   [id number] DOB:   [birth year] Reason for Current Admission: @PPROB @  Discharge Planning Narrative: Discharge follow-up appointments for oncology are current and available on the AVS and MyChart.   Upon discharge from the hospital, hematology/oncology's post discharge plan of care for the outpatient setting is: 02/19/21 with Dr [name], Labs and chemo teaching.   Anely Spiewak will be called within two business days after discharge to review hematology/oncology's plan of care for full understanding.    Outpatient Oncology Specific Care Only: Oncology appointment transportation needs addressed?:  not applicable Oncology medication management for symptom management addressed?:  not applicable Chemo Alert Card reviewed?:  not applicable Immunotherapy Alert Card reviewed?:  not applicable

## 2021-02-19 ENCOUNTER — Inpatient Hospital Stay: Payer: Medicare Other

## 2021-02-19 ENCOUNTER — Inpatient Hospital Stay: Payer: Medicare Other | Attending: Hematology and Oncology | Admitting: Hematology and Oncology

## 2021-02-19 ENCOUNTER — Encounter: Payer: Self-pay | Admitting: Hematology and Oncology

## 2021-02-19 ENCOUNTER — Other Ambulatory Visit: Payer: Self-pay

## 2021-02-19 ENCOUNTER — Telehealth: Payer: Self-pay | Admitting: *Deleted

## 2021-02-19 ENCOUNTER — Other Ambulatory Visit: Payer: Self-pay | Admitting: *Deleted

## 2021-02-19 VITALS — BP 114/55 | HR 94 | Temp 98.1°F | Resp 18 | Wt 137.3 lb

## 2021-02-19 DIAGNOSIS — Z86711 Personal history of pulmonary embolism: Secondary | ICD-10-CM | POA: Insufficient documentation

## 2021-02-19 DIAGNOSIS — I2699 Other pulmonary embolism without acute cor pulmonale: Secondary | ICD-10-CM

## 2021-02-19 DIAGNOSIS — Z7189 Other specified counseling: Secondary | ICD-10-CM | POA: Diagnosis not present

## 2021-02-19 DIAGNOSIS — G893 Neoplasm related pain (acute) (chronic): Secondary | ICD-10-CM | POA: Diagnosis not present

## 2021-02-19 DIAGNOSIS — C259 Malignant neoplasm of pancreas, unspecified: Secondary | ICD-10-CM | POA: Diagnosis not present

## 2021-02-19 DIAGNOSIS — C787 Secondary malignant neoplasm of liver and intrahepatic bile duct: Secondary | ICD-10-CM

## 2021-02-19 DIAGNOSIS — I82402 Acute embolism and thrombosis of unspecified deep veins of left lower extremity: Secondary | ICD-10-CM | POA: Diagnosis not present

## 2021-02-19 DIAGNOSIS — Z7901 Long term (current) use of anticoagulants: Secondary | ICD-10-CM | POA: Diagnosis not present

## 2021-02-19 DIAGNOSIS — K5903 Drug induced constipation: Secondary | ICD-10-CM

## 2021-02-19 DIAGNOSIS — T50905A Adverse effect of unspecified drugs, medicaments and biological substances, initial encounter: Secondary | ICD-10-CM | POA: Diagnosis not present

## 2021-02-19 LAB — COMPREHENSIVE METABOLIC PANEL
ALT: 54 U/L — ABNORMAL HIGH (ref 0–44)
AST: 66 U/L — ABNORMAL HIGH (ref 15–41)
Albumin: 3 g/dL — ABNORMAL LOW (ref 3.5–5.0)
Alkaline Phosphatase: 483 U/L — ABNORMAL HIGH (ref 38–126)
Anion gap: 6 (ref 5–15)
BUN: 17 mg/dL (ref 8–23)
CO2: 30 mmol/L (ref 22–32)
Calcium: 9.9 mg/dL (ref 8.9–10.3)
Chloride: 96 mmol/L — ABNORMAL LOW (ref 98–111)
Creatinine, Ser: 0.55 mg/dL (ref 0.44–1.00)
GFR, Estimated: >60 mL/min (ref >60)
Glucose, Bld: 117 mg/dL — ABNORMAL HIGH (ref 70–99)
Potassium: 4.3 mmol/L (ref 3.5–5.1)
Sodium: 132 mmol/L — ABNORMAL LOW (ref 135–145)
Total Bilirubin: 1 mg/dL (ref 0.3–1.2)
Total Protein: 6.5 g/dL (ref 6.5–8.1)

## 2021-02-19 LAB — CBC WITH DIFFERENTIAL/PLATELET
Abs Immature Granulocytes: 0.62 10*3/uL — ABNORMAL HIGH (ref 0.00–0.07)
Basophils Absolute: 0 10*3/uL (ref 0.0–0.1)
Basophils Relative: 0 %
Eosinophils Absolute: 0 10*3/uL (ref 0.0–0.5)
Eosinophils Relative: 0 %
HCT: 28.2 % — ABNORMAL LOW (ref 36.0–46.0)
Hemoglobin: 8.8 g/dL — ABNORMAL LOW (ref 12.0–15.0)
Immature Granulocytes: 2 %
Lymphocytes Relative: 9 %
Lymphs Abs: 2.4 10*3/uL (ref 0.7–4.0)
MCH: 27.9 pg (ref 26.0–34.0)
MCHC: 31.2 g/dL (ref 30.0–36.0)
MCV: 89.5 fL (ref 80.0–100.0)
Monocytes Absolute: 2.3 10*3/uL — ABNORMAL HIGH (ref 0.1–1.0)
Monocytes Relative: 9 %
Neutro Abs: 21.1 10*3/uL — ABNORMAL HIGH (ref 1.7–7.7)
Neutrophils Relative %: 80 %
Platelets: 302 10*3/uL (ref 150–400)
RBC: 3.15 MIL/uL — ABNORMAL LOW (ref 3.87–5.11)
RDW: 15.3 % (ref 11.5–15.5)
WBC: 26.4 10*3/uL — ABNORMAL HIGH (ref 4.0–10.5)
nRBC: 0.2 % (ref 0.0–0.2)

## 2021-02-19 MED ORDER — BISACODYL 10 MG RE SUPP: 10.0000 mg | RECTAL | 0 refills | Status: DC | PRN

## 2021-02-19 MED ORDER — PROCHLORPERAZINE MALEATE 10 MG PO TABS: 10.0000 mg | ORAL_TABLET | Freq: Four times a day (QID) | ORAL | 1 refills | Status: DC | PRN

## 2021-02-19 MED ORDER — ONDANSETRON HCL 8 MG PO TABS: 8.0000 mg | ORAL_TABLET | Freq: Two times a day (BID) | ORAL | 1 refills | Status: DC | PRN

## 2021-02-19 MED ORDER — LIDOCAINE-PRILOCAINE 2.5-2.5 % EX CREA: TOPICAL_CREAM | CUTANEOUS | 3 refills | Status: DC

## 2021-02-19 NOTE — Progress Notes (Signed)
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

## 2021-02-19 NOTE — Assessment & Plan Note (Signed)
She is currently on Colace, Senokot and MiraLAX.  She just started MiraLAX yesterday.  Encouraged to try bisacodyl suppository.  Discussed about signs of bowel obstruction.  At this time there is no evidence of bowel obstruction.

## 2021-02-19 NOTE — Progress Notes (Signed)
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  During hospitalization, we have discussed about the diagnosis and treatment recommendations in detail.  We have discussed about multiple chemotherapy options although I do not believe she is a candidate for intensive chemotherapy.  We have discussed about considering gemcitabine and Abraxane for metastatic pancreatic cancer.  I do not believe she is a candidate for FOLFIRINOX.  We have discussed about mechanism of action of chemotherapy, adverse effects of chemotherapy including but not limited to fatigue, nausea, vomiting, increased risk of infections, diarrhea, neuropathy cytopenias etc.  We will need a central line, port order placed for urgent port access to get started with chemotherapy as soon as possible.  I have had a very detailed discussion with patient about quality of life on chemotherapy and she is willing to give it a try.  She understands that if her performance status deteriorates and if her quality of life cannot be maintained, we will have to discuss about palliative care and hospice.  We have discussed about median overall survival with this particular chemotherapy.  Patient and family agreeable to trying chemotherapy but they all understand that hospice is an option if she cannot tolerate chemotherapy.  Cancer related pain Patient complains of severe abdominal pain which is well controlled on current pain medication.  She understands that the pain  will never be a 0.  She is currently on OxyContin 15 p.o. every 12 hours and oxycodone as needed for pain.  Today she has only taken her morning dose of OxyContin.  Her daughter-in-law who happens to be an OR nurse is helping her with the medication. We will refer her to palliative care for pain management as well as goals of care discussion when the transition has to happen.  Goals of care, counseling/discussion We had a very lengthy conversation today about expectations and prognosis with this metastatic pancreatic cancer with and without treatment.  Patient is very scared of dying, going in a grave and not going to heaven.  She says she is very scared of the unknown. We discussed that with hospice, the goal is to keep her comfortable and to give her some quality of life along with quality of life.  At this time she is willing to try chemotherapy-she understands that if she were to deteriorate, she will proceed with comfort measures at that time.  Angie Rojas and Angie Rojas, her son is her medical power of attorney and they are all on the same page.  Bilateral pulmonary embolism [facility]) Patient currently continues on anticoagulation, no bleeding complaints.  She will have to continue indefinitely.  Drug induced constipation She is currently on Colace, Senokot and MiraLAX.  She just started MiraLAX yesterday.  Encouraged to try bisacodyl suppository.  Discussed about signs of bowel obstruction.  At this time there is no evidence of bowel obstruction.  Orders Placed This Encounter  Procedures   IR IMAGING GUIDED PORT INSERTION    Standing Status:   Future    Standing Expiration Date:   02/19/2022    Order Specific Question:  Reason for Exam (SYMPTOM  OR DIAGNOSIS REQUIRED)    Answer:   Chemotherapy administration    Order Specific Question:   Preferred Imaging Location?    Answer:   [facility]   Referral to genetics made urgently Referral to palliative care made IR guided port insertion, order  placed. RTC in 2 weeks.  HISTORY OF PRESENTING ILLNESS:  Angie Rojas 76 y.o. female is here because of metastatic pancreatic adenocarcinoma  Angie Rojas is a 76 year old female with a past medical history significant for a left lower extremity DVT diagnosed in January 2023 and recently started on Eliquis.  She presented to the emergency department with abdominal pain.  She has been having abdominal pain for 3 to 4 weeks.  Pain has been intermittent but becoming more constant and radiates to her back.  She rated pain 10/10.  She was also experiencing shortness of breath and chest pain.  Initial lab work showed a WBC of 19.4, hemoglobin 9.2, platelet count 542,000, albumin 2.7, AST 61, ALT 59, alk phos 413.  CTA chest was performed which showed acute PE in few segmental and subsegmental branches in the right lower lobe with small thrombus burden.  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 CEA was mildly elevated at 70.0 and CA19.9 was elevated at 84,088.  She underwent ultrasound-guided liver biopsy on 02/12/2021.   Final pathology showed a poorly differentiated adenocarcinoma, strongly positive for cytokeratin 7, cytokeratin 20 and CDX2 focally positive but negative for GATA3 and PAX8.   Differential diagnosis include pancreaticobiliary less likely upper GI.  She is here for hospital follow-up with her son Angie Rojas and his wife Angie Rojas.  She is in a wheelchair today.   She complains of pain which is better controlled compared to the hospital however not completely gone.  She is taking OxyContin 15 mg twice a day and oxycodone as  needed.  Angie Rojas who is an OR nurse at [employer] mentions that she took 1 dose of OxyContin this morning and has not taken any oxycodone during the day.  Patient appears quite comfortable during my conversation compared to my initial consults during the hospital when she was resting in pain.  Today she complains of bloating, pain in the abdomen.  She most of the times does not look at me while conversing and appears to be focused on the pain.  Son  Angie Rojas was on the phone during our conversation.  Patient states she would really like to fight this because she was very healthy prior to all of this.  She understands that chemotherapy can make her weaker but she would still like to try.  She is very worried about dying.  She is worried about going into the grave and she is worried if she would go to heaven or not.   MEDICAL HISTORY:  Past Medical History:  Diagnosis Date   DVT (deep venous thrombosis) ([facility])    LLE DVT Jan 2023    SURGICAL HISTORY: No past surgical history on file.  SOCIAL HISTORY: Social History   Socioeconomic History   Marital status: Married    Spouse name: Not on file   Number of children: Not on file  Years of education: Not on file   Highest education level: Not on file  Occupational History   Not on file  Tobacco Use   Smoking status: Never   Smokeless tobacco: Never  Substance and Sexual Activity   Alcohol use: Never   Drug use: Not on file   Sexual activity: Not on file  Other Topics Concern   Not on file  Social History Narrative   Not on file   Social Determinants of Health   Financial Resource Strain: Not on file  Food Insecurity: Not on file  Transportation Needs: Not on file  Physical Activity: Not on file  Stress: Not on file  Social Connections: Not on file  Intimate Partner Violence: Not on file    FAMILY HISTORY: No family history on file.  ALLERGIES:  has No Known Allergies.  MEDICATIONS:  Current Outpatient Medications  Medication Sig  Dispense Refill   ALPRAZolam (XANAX) 0.5 MG tablet Take 1 tablet (0.5 mg total) by mouth 3 (three) times daily as needed for anxiety. 30 tablet 0   apixaban (ELIQUIS) 5 MG TABS tablet Take 2 tablets (10 mg total) by mouth 2 (two) times daily for 3 days. 12 tablet 0   [START ON 02/22/2021] apixaban (ELIQUIS) 5 MG TABS tablet Take 1 tablet (5 mg total) by mouth 2 (two) times daily. 60 tablet 3   docusate sodium (COLACE) 100 MG capsule Take 1 capsule (100 mg total) by mouth 2 (two) times daily. 10 capsule 0   lipase/protease/amylase (CREON) 12000-38000 units CPEP capsule Take 1 capsule (12,000 Units total) by mouth 3 (three) times daily before meals. 270 capsule 0   Multiple Vitamin (MULTIVITAMIN WITH MINERALS) TABS tablet Take 1 tablet by mouth daily.     ondansetron (ZOFRAN) 4 MG tablet Take 1 tablet (4 mg total) by mouth every 6 (six) hours as needed for nausea. 20 tablet 0   oxyCODONE (OXY IR/ROXICODONE) 5 MG immediate release tablet Take 1-2 tablets (5-10 mg total) by mouth every 4 (four) hours as needed (mild pain). 30 tablet 0   oxyCODONE (OXYCONTIN) 15 mg 12 hr tablet Take 1 tablet (15 mg total) by mouth every 12 (twelve) hours. 14 tablet 0   polyethylene glycol (MIRALAX / GLYCOLAX) 17 g packet Take 17 g by mouth 2 (two) times daily. 14 each 0   senna (SENOKOT) 8.6 MG TABS tablet Take 1 tablet (8.6 mg total) by mouth daily. 120 tablet 0   sertraline (ZOLOFT) 25 MG tablet Take 1 tablet (25 mg total) by mouth daily. 30 tablet 1   sodium chloride (OCEAN) 0.65 % SOLN nasal spray Place 1 spray into both nostrils 3 (three) times daily.     No current facility-administered medications for this visit.     PHYSICAL EXAMINATION: ECOG PERFORMANCE STATUS: 2 - Symptomatic, <50% confined to bed  Vitals:   02/19/21 1538  BP: (!) 114/55  Pulse: 94  Resp: 18  Temp: 98.1 F (36.7 C)  SpO2: 91%   Filed Weights   02/19/21 1538  Weight: 137 lb 4.8 oz (62.3 kg)    GENERAL:alert, nonicteric, in a  wheelchair  SKIN: No icterus  Abdomen: Appears bloated, bowel sounds present but hypoactive Psych: Tearful, avoids eye contact appears to be a very afraid of unknown.  LABORATORY DATA:  I have reviewed the data as listed Lab Results  Component Value Date   WBC 26.4 (H) 02/19/2021   HGB 8.8 (L) 02/19/2021   HCT 28.2 (L) 02/19/2021   MCV  89.5 02/19/2021   PLT 302 02/19/2021     Chemistry      Component Value Date/Time   NA 132 (L) 02/19/2021 1517   K 4.3 02/19/2021 1517   CL 96 (L) 02/19/2021 1517   CO2 30 02/19/2021 1517   BUN 17 02/19/2021 1517   CREATININE 0.55 02/19/2021 1517      Component Value Date/Time   CALCIUM 9.9 02/19/2021 1517   ALKPHOS 483 (H) 02/19/2021 1517   AST 66 (H) 02/19/2021 1517   ALT 54 (H) 02/19/2021 1517   BILITOT 1.0 02/19/2021 1517       RADIOGRAPHIC STUDIES: I have personally reviewed the radiological images as listed and agreed with the findings in the report. DG Chest 2 View  Result Date: 02/11/2021 CLINICAL DATA:  Shortness of breath and vomiting.  DVT. EXAM: CHEST - 2 VIEW COMPARISON:  None. FINDINGS: Trachea is midline. Heart size normal. Lungs are clear. No pleural fluid. IMPRESSION: No acute findings. Electronically Signed   By: Lorin Picket M.D.   On: 02/11/2021 13:50   CT Head Wo Contrast  Result Date: 02/11/2021 CLINICAL DATA:  Mental status change.  Rule out metastatic disease EXAM: CT HEAD WITHOUT CONTRAST TECHNIQUE: Contiguous axial images were obtained from the base of the skull through the vertex without intravenous contrast. RADIATION DOSE REDUCTION: This exam was performed according to the departmental dose-optimization program which includes automated exposure control, adjustment of the mA and/or kV according to patient size and/or use of iterative reconstruction technique. COMPARISON:  MRI head 07/03/2013 FINDINGS: Brain: No evidence of acute infarction, hemorrhage, hydrocephalus, extra-axial collection or mass lesion/mass  effect. Vascular: Negative for hyperdense vessel Skull: Negative Sinuses/Orbits: Paranasal sinuses clear.  Negative orbit Other: None IMPRESSION: Negative CT head Electronically Signed   By: Franchot Gallo M.D.   On: 02/11/2021 18:07   CT Angio Chest PE W and/or Wo Contrast  Result Date: 02/11/2021 CLINICAL DATA:  Shortness of breath, history of DVT in the left lower extremity EXAM: CT ANGIOGRAPHY CHEST WITH CONTRAST TECHNIQUE: Multidetector CT imaging of the chest was performed using the standard protocol during bolus administration of intravenous contrast. Multiplanar CT image reconstructions and MIPs were obtained to evaluate the vascular anatomy. RADIATION DOSE REDUCTION: This exam was performed according to the departmental dose-optimization program which includes automated exposure control, adjustment of the mA and/or kV according to patient size and/or use of iterative reconstruction technique. CONTRAST:  179mL OMNIPAQUE IOHEXOL 350 MG/ML SOLN COMPARISON:  None. FINDINGS: Cardiovascular: Contrast density in the pulmonary artery branches is less than optimal. There is more contrast enhancement in the systemic vessels suggesting less than optimal timing of the imaging sequence. As far as seen, there are no filling defects in central pulmonary artery branches. There are few intraluminal filling defects in the segmental and subsegmental branches in the right lower lung fields with small thrombus burden. There is no ectasia of main pulmonary artery. Right ventricular cavity is more prominent than usual suggesting possible right ventricular strain. Midportion of right ventricular cavity measures 3.7 cm in diameter. Mediastinum/Nodes: No significant lymphadenopathy seen. Lungs/Pleura: There is no focal pulmonary consolidation. There is no pleural effusion or pneumothorax. Small linear densities in the lower lung fields suggest scarring or subsegmental atelectasis. Upper Abdomen: There are numerous  space-occupying lesions of varying sizes in the visualized upper portions of liver measuring up to 5.3 cm in diameter. There is fatty infiltration in the liver. Musculoskeletal: Unremarkable. Review of the MIP images confirms the above findings. IMPRESSION: Acute  pulmonary embolism in few segmental and subsegmental branches in the right lower lobe. Small thrombus burden. There is dilation of right ventricular cavity suggesting possible right ventricular strain. There is no evidence of thoracic aortic dissection. There is no focal pulmonary consolidation. There are numerous space-occupying lesions of varying sizes and varying attenuation in the liver suggesting extensive hepatic metastatic disease. Fatty infiltration is seen in the liver. Electronically Signed   By: Elmer Picker M.D.   On: 02/11/2021 16:02   CT ABDOMEN PELVIS W CONTRAST  Result Date: 02/11/2021 CLINICAL DATA:  Abdominal pain. EXAM: CT ABDOMEN AND PELVIS WITH CONTRAST TECHNIQUE: Multidetector CT imaging of the abdomen and pelvis was performed using the standard protocol following bolus administration of intravenous contrast. RADIATION DOSE REDUCTION: This exam was performed according to the departmental dose-optimization program which includes automated exposure control, adjustment of the mA and/or kV according to patient size and/or use of iterative reconstruction technique. CONTRAST:  19mL OMNIPAQUE IOHEXOL 350 MG/ML SOLN COMPARISON:  None. FINDINGS: Lower chest: Free motion artifact but no pulmonary lesions or pleural effusions. 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 Largest lesion in the left hepatic lobe on image number 18/3 measures 4.4 cm. Index lesion in the right hepatic lobe on image 35/3 measures 4.1 cm. The gallbladder is grossly normal.  No common bile duct dilatation. Pancreas: There is a 2.6 cm lesion in the pancreatic tail which could be the primary neoplasm. There is also a  small lesion in the body tail junction region on image 32/3 which measures 10 mm. Spleen: Normal size.  No splenic lesions. Adrenals/Urinary Tract: No adrenal gland lesions. Small renal cysts. No worrisome renal lesions. The bladder is unremarkable. Stomach/Bowel: The stomach, duodenum, small bowel and terminal ileum are grossly normal. No acute inflammatory process, mass lesions or obstructive findings. No obvious colonic lesions to suggest a colonic primary. The appendix is normal. Vascular/Lymphatic: The aorta and branch vessels are patent. Scattered atherosclerotic calcifications. 2.2 cm the celiac axis lymph node on image 30/3. There are scattered borderline retroperitoneal lymph nodes without overt adenopathy. Nodular mesenteric implant on image 55/3 measures 2.1 cm. Mesenteric implant on image 57/3 measures 18 mm. Reproductive: Calcified right-sided fibroid. There is also a E 3.7 cm mass which could be a partially degenerated fibroid. Could not exclude the possibility of endometrial cancer. Pelvic ultrasound or MRI pelvis may be helpful for further evaluation. No adnexal masses. Prominent gonadal veins bilaterally. Other: Small amount of free pelvic fluid is noted. No inguinal adenopathy. Musculoskeletal: No significant bony findings. IMPRESSION: 1. Diffuse hepatic metastatic disease. 2. 2.6 cm lesion in the pancreatic tail could be the primary neoplasm. However there is a second lesion in the pancreatic body/tail junction region and this could reflect metastatic disease also. 3. Celiac axis lymphadenopathy and mesenteric implants. 4. Borderline retroperitoneal lymph nodes. 5. 3.7 cm mass in the uterus could be a partially degenerated fibroid. Could not exclude the possibility of any medial cancer. PET-CT may be helpful to evaluate all of the above findings and to potentially guide a safe and appropriate biopsy. Electronically Signed   By: Marijo Sanes M.D.   On: 02/11/2021 16:07   US BIOPSY  (LIVER)  Result Date: 02/13/2021 INDICATION: Liver metastases, suspect pancreas primary by imaging. EXAM: ULTRASOUND CORE BIOPSY RIGHT LIVER METASTASIS MEDICATIONS: 1% LIDOCAINE LOCAL ANESTHESIA/SEDATION: Moderate (conscious) sedation was employed during this procedure. A total of Versed 1.0 mg and Fentanyl 25 mcg was administered intravenously by the radiology nurse. Total  intra-service moderate Sedation Time: 7 minutes. The patient's level of consciousness and vital signs were monitored continuously by radiology nursing throughout the procedure under my direct supervision. FLUOROSCOPY TIME:  Fluoroscopy Time: None. COMPLICATIONS: None immediate. PROCEDURE: Informed written consent was obtained from the patient after a thorough discussion of the procedural risks, benefits and alternatives. All questions were addressed. Maximal Sterile Barrier Technique was utilized including caps, mask, sterile gowns, sterile gloves, sterile drape, hand hygiene and skin antiseptic. A timeout was performed prior to the initiation of the procedure. Previous imaging reviewed. Preliminary ultrasound performed. A right hepatic lesion was localized in the mid axillary line through a lower intercostal space. Overlying skin marked. Under sterile conditions and local anesthesia, a 17 gauge coaxial guide needle was advanced into the lesion. Needle position confirmed with ultrasound. 18 gauge core biopsies obtained. Samples placed in formalin. Needle tract occluded with Gel-Foam. Postprocedure imaging demonstrates no hemorrhage or hematoma. Patient tolerated biopsy well. IMPRESSION: Successful ultrasound core biopsy of the right liver metastasis. Electronically Signed   By: Jerilynn Mages.  Shick M.D.   On: 02/13/2021 15:10   US Venous Img Lower Unilateral Left (DVT)  Result Date: 02/03/2021 CLINICAL DATA:  Left lower extremity pain. EXAM: Left LOWER EXTREMITY VENOUS DOPPLER ULTRASOUND TECHNIQUE: Gray-scale sonography with graded compression, as  well as color Doppler and duplex ultrasound were performed to evaluate the lower extremity deep venous systems from the level of the common femoral vein and including the common femoral, femoral, profunda femoral, popliteal and calf veins including the posterior tibial, peroneal and gastrocnemius veins when visible. The superficial great saphenous vein was also interrogated. Spectral Doppler was utilized to evaluate flow at rest and with distal augmentation maneuvers in the common femoral, femoral and popliteal veins. COMPARISON:  January 15, 2021. FINDINGS: Contralateral Common Femoral Vein: Respiratory phasicity is normal and symmetric with the symptomatic side. No evidence of thrombus. Normal compressibility. Common Femoral Vein: No evidence of thrombus. Normal compressibility, respiratory phasicity and response to augmentation. Saphenofemoral Junction: No evidence of thrombus. Normal compressibility and flow on color Doppler imaging. Profunda Femoral Vein: No evidence of thrombus. Normal compressibility and flow on color Doppler imaging. Femoral Vein: Noncompressible with no flow consistent with occlusive thrombus. Popliteal Vein: No evidence of thrombus. Normal compressibility, respiratory phasicity and response to augmentation. Calf Veins: Posterior tibial and peroneal veins are noncompressible consistent with occlusive thrombus. Superficial Great Saphenous Vein: No evidence of thrombus. Normal compressibility. Venous Reflux:  None. Other Findings:  None. IMPRESSION: Occlusive deep venous thrombosis is noted in left femoral, posterior tibial and peroneal veins. Electronically Signed   By: Marijo Conception M.D.   On: 02/03/2021 14:08   ECHOCARDIOGRAM COMPLETE  Result Date: 02/12/2021    ECHOCARDIOGRAM REPORT   Patient Name:   MARIGNY BORRE Date of Exam: 02/12/2021 Medical Rec #:  [id number]         Height:       60.0 in Accession #:    [id number]        Weight:       120.0 lb Date of Birth:  Mar 08, 1945          BSA:          1.502 m Patient Age:    46 years          BP:           103/59 mmHg Patient Gender: F                 HR:  97 bpm. Exam Location:  Inpatient Procedure: 2D Echo Indications:    Pulmonary embolism  History:        Patient has no prior history of Echocardiogram examinations.  Sonographer:    Arlyss Gandy Referring Phys: [id number] RAVI PAHWANI IMPRESSIONS  1. Left ventricular ejection fraction, by estimation, is 60 to 65%. The left ventricle has normal function. The left ventricle has no regional wall motion abnormalities. Left ventricular diastolic parameters were normal.  2. Right ventricular systolic function is normal. The right ventricular size is normal. There is normal pulmonary artery systolic pressure.  3. The mitral valve is normal in structure. Trivial mitral valve regurgitation. No evidence of mitral stenosis.  4. The aortic valve is tricuspid. Aortic valve regurgitation is not visualized. No aortic stenosis is present.  5. The inferior vena cava is normal in size with greater than 50% respiratory variability, suggesting right atrial pressure of 3 mmHg. Comparison(s): No prior Echocardiogram. FINDINGS  Left Ventricle: Left ventricular ejection fraction, by estimation, is 60 to 65%. The left ventricle has normal function. The left ventricle has no regional wall motion abnormalities. The left ventricular internal cavity size was normal in size. There is  no left ventricular hypertrophy. Left ventricular diastolic parameters were normal. Right Ventricle: The right ventricular size is normal. Right ventricular systolic function is normal. There is normal pulmonary artery systolic pressure. The tricuspid regurgitant velocity is 2.51 m/s, and with an assumed right atrial pressure of 3 mmHg,  the estimated right ventricular systolic pressure is 54.6 mmHg. Left Atrium: Left atrial size was normal in size. Right Atrium: Right atrial size was normal in size. Pericardium: There is no evidence  of pericardial effusion. Mitral Valve: The mitral valve is normal in structure. Trivial mitral valve regurgitation. No evidence of mitral valve stenosis. Tricuspid Valve: The tricuspid valve is normal in structure. Tricuspid valve regurgitation is mild . No evidence of tricuspid stenosis. Aortic Valve: The aortic valve is tricuspid. Aortic valve regurgitation is not visualized. No aortic stenosis is present. Aortic valve mean gradient measures 7.0 mmHg. Aortic valve peak gradient measures 13.8 mmHg. Pulmonic Valve: The pulmonic valve was normal in structure. Pulmonic valve regurgitation is not visualized. No evidence of pulmonic stenosis. Aorta: The aortic root is normal in size and structure. Venous: The inferior vena cava is normal in size with greater than 50% respiratory variability, suggesting right atrial pressure of 3 mmHg. IAS/Shunts: No atrial level shunt detected by color flow Doppler.  LEFT VENTRICLE PLAX 2D LVIDd:         3.80 cm   Diastology LVIDs:         2.70 cm   LV e' medial:    11.00 cm/s LV PW:         0.80 cm   LV E/e' medial:  9.6 LV IVS:        0.80 cm   LV e' lateral:   11.00 cm/s LVOT diam:     1.80 cm   LV E/e' lateral: 9.6 LV SV:         69 LV SV Index:   46 LVOT Area:     2.54 cm  RIGHT VENTRICLE             IVC RV Basal diam:  3.20 cm     IVC diam: 1.50 cm RV Mid diam:    2.50 cm RV S prime:     15.40 cm/s TAPSE (M-mode): 2.1 cm LEFT ATRIUM  Index        RIGHT ATRIUM           Index LA diam:        2.90 cm 1.93 cm/m   RA Area:     11.40 cm LA Vol (A2C):   30.4 ml 20.24 ml/m  RA Volume:   23.70 ml  15.78 ml/m LA Vol (A4C):   50.8 ml 33.81 ml/m LA Biplane Vol: 40.8 ml 27.16 ml/m  AORTIC VALVE AV Area (Vmax):    1.67 cm AV Area (Vmean):   1.78 cm AV Area (VTI):     1.89 cm AV Vmax:           186.00 cm/s AV Vmean:          120.000 cm/s AV VTI:            0.363 m AV Peak Grad:      13.8 mmHg AV Mean Grad:      7.0 mmHg LVOT Vmax:         122.00 cm/s LVOT Vmean:         83.900 cm/s LVOT VTI:          0.270 m LVOT/AV VTI ratio: 0.74  AORTA Ao Root diam: 2.60 cm Ao Asc diam:  2.60 cm MITRAL VALVE                TRICUSPID VALVE MV Area (PHT): 2.79 cm     TR Peak grad:   25.2 mmHg MV Decel Time: 272 msec     TR Vmax:        251.00 cm/s MV E velocity: 106.00 cm/s MV A velocity: 94.70 cm/s   SHUNTS MV E/A ratio:  1.12         Systemic VTI:  0.27 m                             Systemic Diam: 1.80 cm Kirk Ruths MD Electronically signed by Kirk Ruths MD Signature Date/Time: 02/12/2021/11:40:05 AM    Final     All questions were answered. The patient knows to call the clinic with any problems, questions or concerns. I spent 45 minutes in the care of this patient including H and P, review of records, counseling and coordination of care.     Benay Pike, MD 02/19/2021 5:28 PM

## 2021-02-19 NOTE — Assessment & Plan Note (Signed)
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  During hospitalization, we have discussed about the diagnosis and treatment recommendations in detail.  We have discussed about multiple chemotherapy options although I do not believe she is a candidate for intensive chemotherapy.  We have discussed about considering gemcitabine and Abraxane for metastatic pancreatic cancer.  I do not believe she is a candidate for FOLFIRINOX.  We have discussed about mechanism of action of chemotherapy, adverse effects of chemotherapy including but not limited to fatigue, nausea, vomiting, increased risk of infections, diarrhea, neuropathy cytopenias etc.  We will need a central line, port order placed for urgent port access to get started with chemotherapy as soon as possible.  I have had a very detailed discussion with patient about quality of life on chemotherapy and she is willing to give it a try.  She understands that if her performance status deteriorates and if her quality of life cannot be maintained, we will have to discuss about palliative care and hospice.  We have discussed about median overall survival with this particular chemotherapy.  Patient and family agreeable to trying chemotherapy but they all understand that hospice is an option if she cannot tolerate chemotherapy.

## 2021-02-19 NOTE — Assessment & Plan Note (Signed)
We had a very lengthy conversation today about expectations and prognosis with this metastatic pancreatic cancer with and without treatment.  Patient is very scared of dying, going in a grave and not going to heaven.  She says she is very scared of the unknown. We discussed that with hospice, the goal is to keep her comfortable and to give her some quality of life along with quality of life.  At this time she is willing to try chemotherapy-she understands that if she were to deteriorate, she will proceed with comfort measures at that time.  [name] and Juanda Crumble, her son is her medical power of attorney and they are all on the same page.

## 2021-02-19 NOTE — Assessment & Plan Note (Signed)
Patient currently continues on anticoagulation, no bleeding complaints.  She will have to continue indefinitely.

## 2021-02-19 NOTE — Assessment & Plan Note (Addendum)
Patient complains of severe abdominal pain which is well controlled on current pain medication.  She understands that the pain will never be a 0.  She is currently on OxyContin 15 p.o. every 12 hours and oxycodone as needed for pain.  Today she has only taken her morning dose of OxyContin.  Her daughter-in-law who happens to be an OR nurse is helping her with the medication. We will refer her to palliative care for pain management as well as goals of care discussion when the transition has to happen.

## 2021-02-19 NOTE — Telephone Encounter (Signed)
Angie Rojas was contacted by telephone to verify understanding of discharge instructions status post their most recent discharge from the hospital on the date:  02/19/21 @ 330 for lab and 4:00 with Dr [name].  Inpatient discharge AVS was re-reviewed with patient, along with cancer center appointments.  Verification of understanding for oncology specific follow-up was validated using the Teach Back method.    Transportation to appointments were confirmed for the patient as being self/caregiver.  Tianah Lonardo questions were addressed to their satisfaction upon completion of this post discharge follow-up call for outpatient oncology.

## 2021-02-20 ENCOUNTER — Telehealth: Payer: Self-pay

## 2021-02-20 ENCOUNTER — Inpatient Hospital Stay: Payer: Medicare Other

## 2021-02-20 ENCOUNTER — Other Ambulatory Visit: Payer: Self-pay

## 2021-02-20 LAB — CANCER ANTIGEN 19-9: CA 19-9: 102980 U/mL — ABNORMAL HIGH (ref 0–35)

## 2021-02-20 NOTE — Progress Notes (Signed)
error 

## 2021-02-20 NOTE — Telephone Encounter (Signed)
Called pt son, pt was in the car with him.  Shared appointment date/time for port placement.  Instructed pt, per IR, patient to be NPO after 8am, may take her morning meds with water but do not take Eliquis that morning.  Pt son and pt verbalized understanding.  Pt son requested I email this info to him, they do not have access to mychart, therefore; I told pt I would call back and leave this all on his VM for reference.  I ended the call and then placed a return call back to [place] and left these details on the voicemail.

## 2021-02-22 DIAGNOSIS — Z7901 Long term (current) use of anticoagulants: Secondary | ICD-10-CM | POA: Diagnosis not present

## 2021-02-22 DIAGNOSIS — I82412 Acute embolism and thrombosis of left femoral vein: Secondary | ICD-10-CM | POA: Diagnosis not present

## 2021-02-22 DIAGNOSIS — D259 Leiomyoma of uterus, unspecified: Secondary | ICD-10-CM | POA: Diagnosis not present

## 2021-02-22 DIAGNOSIS — Z9181 History of falling: Secondary | ICD-10-CM | POA: Diagnosis not present

## 2021-02-22 DIAGNOSIS — C252 Malignant neoplasm of tail of pancreas: Secondary | ICD-10-CM | POA: Diagnosis not present

## 2021-02-22 DIAGNOSIS — C251 Malignant neoplasm of body of pancreas: Secondary | ICD-10-CM | POA: Diagnosis not present

## 2021-02-22 DIAGNOSIS — C787 Secondary malignant neoplasm of liver and intrahepatic bile duct: Secondary | ICD-10-CM | POA: Diagnosis not present

## 2021-02-22 DIAGNOSIS — F419 Anxiety disorder, unspecified: Secondary | ICD-10-CM | POA: Diagnosis not present

## 2021-02-22 DIAGNOSIS — I10 Essential (primary) hypertension: Secondary | ICD-10-CM | POA: Diagnosis not present

## 2021-02-22 DIAGNOSIS — D630 Anemia in neoplastic disease: Secondary | ICD-10-CM | POA: Diagnosis not present

## 2021-02-22 DIAGNOSIS — E43 Unspecified severe protein-calorie malnutrition: Secondary | ICD-10-CM | POA: Diagnosis not present

## 2021-02-22 DIAGNOSIS — D72829 Elevated white blood cell count, unspecified: Secondary | ICD-10-CM | POA: Diagnosis not present

## 2021-02-22 DIAGNOSIS — I2693 Single subsegmental pulmonary embolism without acute cor pulmonale: Secondary | ICD-10-CM | POA: Diagnosis not present

## 2021-02-22 DIAGNOSIS — Z79891 Long term (current) use of opiate analgesic: Secondary | ICD-10-CM | POA: Diagnosis not present

## 2021-02-22 DIAGNOSIS — K5900 Constipation, unspecified: Secondary | ICD-10-CM | POA: Diagnosis not present

## 2021-02-23 ENCOUNTER — Other Ambulatory Visit: Payer: Self-pay | Admitting: Radiology

## 2021-02-23 ENCOUNTER — Other Ambulatory Visit: Payer: Self-pay

## 2021-02-23 ENCOUNTER — Other Ambulatory Visit: Payer: Self-pay | Admitting: Student

## 2021-02-23 ENCOUNTER — Other Ambulatory Visit (HOSPITAL_COMMUNITY): Payer: Self-pay | Admitting: Physician Assistant

## 2021-02-24 ENCOUNTER — Inpatient Hospital Stay: Payer: Medicare Other

## 2021-02-24 ENCOUNTER — Inpatient Hospital Stay: Payer: Medicare Other | Admitting: Hematology and Oncology

## 2021-02-24 ENCOUNTER — Ambulatory Visit (HOSPITAL_COMMUNITY)
Admission: RE | Admit: 2021-02-24 | Discharge: 2021-02-24 | Disposition: A | Discharge status: Home / Self Care | Payer: Medicare Other | Source: Ambulatory Visit | Attending: Hematology and Oncology | Admitting: Hematology and Oncology

## 2021-02-24 ENCOUNTER — Other Ambulatory Visit: Payer: Self-pay

## 2021-02-24 DIAGNOSIS — C259 Malignant neoplasm of pancreas, unspecified: Secondary | ICD-10-CM | POA: Diagnosis not present

## 2021-02-24 DIAGNOSIS — Z452 Encounter for adjustment and management of vascular access device: Secondary | ICD-10-CM | POA: Diagnosis not present

## 2021-02-24 DIAGNOSIS — I2693 Single subsegmental pulmonary embolism without acute cor pulmonale: Secondary | ICD-10-CM | POA: Insufficient documentation

## 2021-02-24 DIAGNOSIS — C787 Secondary malignant neoplasm of liver and intrahepatic bile duct: Secondary | ICD-10-CM | POA: Diagnosis not present

## 2021-02-24 HISTORY — PX: IR IMAGING GUIDED PORT INSERTION: IMG5740

## 2021-02-24 MED ORDER — FENTANYL CITRATE (PF) 100 MCG/2ML IJ SOLN
INTRAMUSCULAR | Status: AC | PRN
  Administered 2021-02-24 (×4): 25 ug via INTRAVENOUS

## 2021-02-24 MED ORDER — SODIUM CHLORIDE 0.9 % IV SOLN: INTRAVENOUS | Status: DC

## 2021-02-24 MED ORDER — LIDOCAINE-EPINEPHRINE 1 %-1:100000 IJ SOLN
INTRAMUSCULAR | Status: AC
  Filled 2021-02-24: qty 1

## 2021-02-24 MED ORDER — MIDAZOLAM HCL 2 MG/2ML IJ SOLN
INTRAMUSCULAR | Status: AC | PRN
  Administered 2021-02-24: 1 mg via INTRAVENOUS
  Administered 2021-02-24 (×2): .5 mg via INTRAVENOUS

## 2021-02-24 MED ORDER — HEPARIN SOD (PORK) LOCK FLUSH 100 UNIT/ML IV SOLN
INTRAVENOUS | Status: AC
  Filled 2021-02-24: qty 5

## 2021-02-24 MED ORDER — MIDAZOLAM HCL 2 MG/2ML IJ SOLN
INTRAMUSCULAR | Status: AC
  Filled 2021-02-24: qty 2

## 2021-02-24 MED ORDER — FENTANYL CITRATE (PF) 100 MCG/2ML IJ SOLN
INTRAMUSCULAR | Status: AC
  Filled 2021-02-24: qty 2

## 2021-02-24 NOTE — H&P (Signed)
Chief Complaint: Patient was seen in consultation today for port Rojas catheter placement  at the request of [place]  Referring Physician(s): Angie Rojas,Angie Rojas  Supervising Physician: Angie Rojas  Patient Status: [facility] - Out-pt  History of Present Illness:  Pt has no significant PMH. She presented to ED 02/11/21 with acute PE and was diagnosed with metastatic pancreatic adenocarcinoma. Dr. Lonell Rojas has referred pt to IR for port-Rojas-catheter placement so she may start chemotherapy.   Past Medical History:  Diagnosis Date   DVT (deep venous thrombosis) (HCC)    LLE DVT Jan 2023    No past surgical history on file.  Allergies: Patient has no known allergies.  Medications: Prior to Admission medications   Medication Sig Start Date End Date Taking? Authorizing Provider  ALPRAZolam Duanne Moron) 0.5 MG tablet Take 1 tablet (0.5 mg total) by mouth 3 (three) times daily as needed for anxiety. 02/18/21  Yes Angie Rojas, Angie A, MD  apixaban (ELIQUIS) 5 MG TABS tablet Take 1 tablet (5 mg total) by mouth 2 (two) times daily. 02/22/21  Yes Angie Rojas, Angie A, MD  bisacodyl (DULCOLAX) 10 MG suppository Place 1 suppository (10 mg total) rectally as needed for moderate constipation. 02/19/21  Yes Angie Rojas, Arletha Pili, MD  Carboxymethylcellulose Sodium (THERATEARS OP) Place 1 drop into both eyes daily as needed (dry eyes).   Yes [provider]  docusate sodium (COLACE) 100 MG capsule Take 1 capsule (100 mg total) by mouth 2 (two) times daily. 02/18/21  Yes Angie Rojas, Angie A, MD  lipase/protease/amylase (CREON) 12000-38000 units CPEP capsule Take 1 capsule (12,000 Units total) by mouth 3 (three) times daily before meals. 02/18/21  Yes Angie Rojas, Angie A, MD  Multiple Vitamin (MULTIVITAMIN WITH MINERALS) TABS tablet Take 1 tablet by mouth daily.   Yes [provider]  ondansetron (ZOFRAN) 4 MG tablet Take 1 tablet (4 mg total) by mouth every 6 (six) hours as needed for nausea. 02/18/21  Yes  Angie Rojas, Angie A, MD  OVER THE COUNTER MEDICATION Take 7.5 mLs by mouth 2 (two) times daily. CBD oil liquid   Yes [provider]  oxyCODONE (OXY IR/ROXICODONE) 5 MG immediate release tablet Take 1-2 tablets (5-10 mg total) by mouth every 4 (four) hours as needed (mild pain). 02/18/21  Yes Angie Rojas, Angie A, MD  oxyCODONE (OXYCONTIN) 15 mg 12 hr tablet Take 1 tablet (15 mg total) by mouth every 12 (twelve) hours. 02/18/21  Yes Angie Rojas, Angie A, MD  polyethylene glycol (MIRALAX / GLYCOLAX) 17 g packet Take 17 g by mouth 2 (two) times daily. Patient taking differently: Take 17 g by mouth 2 (two) times daily as needed for moderate constipation. 02/18/21  Yes Angie Rojas, Angie A, MD  senna (SENOKOT) 8.6 MG TABS tablet Take 1 tablet (8.6 mg total) by mouth daily. 02/19/21  Yes Angie Rojas, Angie A, MD  sertraline (ZOLOFT) 25 MG tablet Take 1 tablet (25 mg total) by mouth daily. 02/19/21  Yes Angie Rojas, Angie A, MD  lidocaine-prilocaine (EMLA) cream Apply to affected area once 02/19/21   Angie Pike, MD  ondansetron (ZOFRAN) 8 MG tablet Take 1 tablet (8 mg total) by mouth 2 (two) times daily as needed (Nausea or vomiting). 02/19/21   Angie Pike, MD  prochlorperazine (COMPAZINE) 10 MG tablet Take 1 tablet (10 mg total) by mouth every 6 (six) hours as needed (Nausea or vomiting). 02/19/21   Angie Pike, MD     No family history on file.  Social History   Socioeconomic History   Marital status: Married  Spouse name: Not on file   Number of children: Not on file   Years of education: Not on file   Highest education level: Not on file  Occupational History   Not on file  Tobacco Use   Smoking status: Never   Smokeless tobacco: Never  Substance and Sexual Activity   Alcohol use: Never   Drug use: Not on file   Sexual activity: Not on file  Other Topics Concern   Not on file  Social History Narrative   Not on file   Social Determinants of Health   Financial Resource  Strain: Not on file  Food Insecurity: Not on file  Transportation Needs: Not on file  Physical Activity: Not on file  Stress: Not on file  Social Connections: Not on file     Review of Systems: Rojas 12 point ROS discussed and pertinent positives are indicated in the HPI above.  All other systems are negative.  Review of Systems  Constitutional:  Positive for appetite change and diaphoresis. Negative for chills and fever.  HENT:  Positive for nosebleeds.   Eyes:  Negative for visual disturbance.  Respiratory:  Positive for shortness of breath. Negative for cough.   Cardiovascular:  Positive for leg swelling. Negative for chest pain.  Gastrointestinal:  Positive for abdominal pain and nausea. Negative for blood in stool and vomiting.  Genitourinary:  Negative for hematuria.  Neurological:  Negative for dizziness, light-headedness and headaches.   Vital Signs: BP 101/61 (BP Location: Right Arm)    Pulse 70    Temp 98.2 F (36.8 C) (Oral)    Ht 5' (1.524 m)    Wt 137 lb (62.1 kg)    SpO2 100%    BMI 26.76 kg/m   Physical Exam Vitals reviewed.  Constitutional:      Appearance: Normal appearance.  HENT:     Head: Normocephalic and atraumatic.  Cardiovascular:     Rate and Rhythm: Normal rate and regular rhythm.     Pulses: Normal pulses.     Heart sounds: Normal heart sounds. No murmur heard.   No friction rub. No gallop.  Pulmonary:     Effort: Pulmonary effort is normal. No respiratory distress.     Breath sounds: Normal breath sounds. No stridor. No wheezing, rhonchi or rales.  Abdominal:     General: Bowel sounds are normal. There is no distension.     Palpations: Abdomen is soft.     Tenderness: There is abdominal tenderness. There is no guarding.  Skin:    General: Skin is warm and dry.  Neurological:     Mental Status: She is alert and oriented to person, place, and time.  Psychiatric:        Mood and Affect: Mood normal.        Behavior: Behavior normal.         Thought Content: Thought content normal.        Judgment: Judgment normal.    Imaging: DG Chest 2 View  Result Date: 02/11/2021 CLINICAL DATA:  Shortness of breath and vomiting.  DVT. EXAM: CHEST - 2 VIEW COMPARISON:  None. FINDINGS: Trachea is midline. Heart size normal. Lungs are clear. No pleural fluid. IMPRESSION: No acute findings. Electronically Signed   By: Lorin Picket M.D.   On: 02/11/2021 13:50   CT Head Wo Contrast  Result Date: 02/11/2021 CLINICAL DATA:  Mental status change.  Rule out metastatic disease EXAM: CT HEAD WITHOUT CONTRAST TECHNIQUE: Contiguous axial images were obtained  from the base of the skull through the vertex without intravenous contrast. RADIATION DOSE REDUCTION: This exam was performed according to the departmental dose-optimization program which includes automated exposure control, adjustment of the mA and/or kV according to patient size and/or use of iterative reconstruction technique. COMPARISON:  MRI head 07/03/2013 FINDINGS: Brain: No evidence of acute infarction, hemorrhage, hydrocephalus, extra-axial collection or mass lesion/mass effect. Vascular: Negative for hyperdense vessel Skull: Negative Sinuses/Orbits: Paranasal sinuses clear.  Negative orbit Other: None IMPRESSION: Negative CT head Electronically Signed   By: Franchot Gallo M.D.   On: 02/11/2021 18:07   CT Angio Chest PE W and/or Wo Contrast  Result Date: 02/11/2021 CLINICAL DATA:  Shortness of breath, history of DVT in the left lower extremity EXAM: CT ANGIOGRAPHY CHEST WITH CONTRAST TECHNIQUE: Multidetector CT imaging of the chest was performed using the standard protocol during bolus administration of intravenous contrast. Multiplanar CT image reconstructions and MIPs were obtained to evaluate the vascular anatomy. RADIATION DOSE REDUCTION: This exam was performed according to the departmental dose-optimization program which includes automated exposure control, adjustment of the mA and/or kV  according to patient size and/or use of iterative reconstruction technique. CONTRAST:  165mL OMNIPAQUE IOHEXOL 350 MG/ML SOLN COMPARISON:  None. FINDINGS: Cardiovascular: Contrast density in the pulmonary artery branches is less than optimal. There is more contrast enhancement in the systemic vessels suggesting less than optimal timing of the imaging sequence. As far as seen, there are no filling defects in central pulmonary artery branches. There are few intraluminal filling defects in the segmental and subsegmental branches in the right lower lung fields with small thrombus burden. There is no ectasia of main pulmonary artery. Right ventricular cavity is more prominent than usual suggesting possible right ventricular strain. Midportion of right ventricular cavity measures 3.7 cm in diameter. Mediastinum/Nodes: No significant lymphadenopathy seen. Lungs/Pleura: There is no focal pulmonary consolidation. There is no pleural effusion or pneumothorax. Small linear densities in the lower lung fields suggest scarring or subsegmental atelectasis. Upper Abdomen: There are numerous space-occupying lesions of varying sizes in the visualized upper portions of liver measuring up to 5.3 cm in diameter. There is fatty infiltration in the liver. Musculoskeletal: Unremarkable. Review of the MIP images confirms the above findings. IMPRESSION: Acute pulmonary embolism in few segmental and subsegmental branches in the right lower lobe. Small thrombus burden. There is dilation of right ventricular cavity suggesting possible right ventricular strain. There is no evidence of thoracic aortic dissection. There is no focal pulmonary consolidation. There are numerous space-occupying lesions of varying sizes and varying attenuation in the liver suggesting extensive hepatic metastatic disease. Fatty infiltration is seen in the liver. Electronically Signed   By: Elmer Picker M.D.   On: 02/11/2021 16:02   CT ABDOMEN PELVIS W  CONTRAST  Result Date: 02/11/2021 CLINICAL DATA:  Abdominal pain. EXAM: CT ABDOMEN AND PELVIS WITH CONTRAST TECHNIQUE: Multidetector CT imaging of the abdomen and pelvis was performed using the standard protocol following bolus administration of intravenous contrast. RADIATION DOSE REDUCTION: This exam was performed according to the departmental dose-optimization program which includes automated exposure control, adjustment of the mA and/or kV according to patient size and/or use of iterative reconstruction technique. CONTRAST:  168mL OMNIPAQUE IOHEXOL 350 MG/ML SOLN COMPARISON:  None. FINDINGS: Lower chest: Free motion artifact but no pulmonary lesions or pleural effusions. 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 Largest lesion in the left hepatic lobe on image number 18/3 measures 4.4 cm. Index lesion in the  right hepatic lobe on image 35/3 measures 4.1 cm. The gallbladder is grossly normal.  No common bile duct dilatation. Pancreas: There is Rojas 2.6 cm lesion in the pancreatic tail which could be the primary neoplasm. There is also Rojas small lesion in the body tail junction region on image 32/3 which measures 10 mm. Spleen: Normal size.  No splenic lesions. Adrenals/Urinary Tract: No adrenal gland lesions. Small renal cysts. No worrisome renal lesions. The bladder is unremarkable. Stomach/Bowel: The stomach, duodenum, small bowel and terminal ileum are grossly normal. No acute inflammatory process, mass lesions or obstructive findings. No obvious colonic lesions to suggest Rojas colonic primary. The appendix is normal. Vascular/Lymphatic: The aorta and branch vessels are patent. Scattered atherosclerotic calcifications. 2.2 cm the celiac axis lymph node on image 30/3. There are scattered borderline retroperitoneal lymph nodes without overt adenopathy. Nodular mesenteric implant on image 55/3 measures 2.1 cm. Mesenteric implant on image 57/3 measures 18 mm. Reproductive:  Calcified right-sided fibroid. There is also Rojas E 3.7 cm mass which could be Rojas partially degenerated fibroid. Could not exclude the possibility of endometrial cancer. Pelvic ultrasound or MRI pelvis may be helpful for further evaluation. No adnexal masses. Prominent gonadal veins bilaterally. Other: Small amount of free pelvic fluid is noted. No inguinal adenopathy. Musculoskeletal: No significant bony findings. IMPRESSION: 1. Diffuse hepatic metastatic disease. 2. 2.6 cm lesion in the pancreatic tail could be the primary neoplasm. However there is Rojas second lesion in the pancreatic body/tail junction region and this could reflect metastatic disease also. 3. Celiac axis lymphadenopathy and mesenteric implants. 4. Borderline retroperitoneal lymph nodes. 5. 3.7 cm mass in the uterus could be Rojas partially degenerated fibroid. Could not exclude the possibility of any medial cancer. PET-CT may be helpful to evaluate all of the above findings and to potentially guide Rojas safe and appropriate biopsy. Electronically Signed   By: Marijo Sanes M.D.   On: 02/11/2021 16:07   US BIOPSY (LIVER)  Result Date: 02/13/2021 INDICATION: Liver metastases, suspect pancreas primary by imaging. EXAM: ULTRASOUND CORE BIOPSY RIGHT LIVER METASTASIS MEDICATIONS: 1% LIDOCAINE LOCAL ANESTHESIA/SEDATION: Moderate (conscious) sedation was employed during this procedure. Rojas total of Versed 1.0 mg and Fentanyl 25 mcg was administered intravenously by the radiology nurse. Total intra-service moderate Sedation Time: 7 minutes. The patient's level of consciousness and vital signs were monitored continuously by radiology nursing throughout the procedure under my direct supervision. FLUOROSCOPY TIME:  Fluoroscopy Time: None. COMPLICATIONS: None immediate. PROCEDURE: Informed written consent was obtained from the patient after Rojas thorough discussion of the procedural risks, benefits and alternatives. All questions were addressed. Maximal Sterile Barrier  Technique was utilized including caps, mask, sterile gowns, sterile gloves, sterile drape, hand hygiene and skin antiseptic. Rojas timeout was performed prior to the initiation of the procedure. Previous imaging reviewed. Preliminary ultrasound performed. Rojas right hepatic lesion was localized in the mid axillary line through Rojas lower intercostal space. Overlying skin marked. Under sterile conditions and local anesthesia, Rojas 17 gauge coaxial guide needle was advanced into the lesion. Needle position confirmed with ultrasound. 18 gauge core biopsies obtained. Samples placed in formalin. Needle tract occluded with Gel-Foam. Postprocedure imaging demonstrates no hemorrhage or hematoma. Patient tolerated biopsy well. IMPRESSION: Successful ultrasound core biopsy of the right liver metastasis. Electronically Signed   By: Jerilynn Mages.  Shick M.D.   On: 02/13/2021 15:10   US Venous Img Lower Unilateral Left (DVT)  Result Date: 02/03/2021 CLINICAL DATA:  Left lower extremity pain. EXAM: Left LOWER EXTREMITY VENOUS DOPPLER  ULTRASOUND TECHNIQUE: Gray-scale sonography with graded compression, as well as color Doppler and duplex ultrasound were performed to evaluate the lower extremity deep venous systems from the level of the common femoral vein and including the common femoral, femoral, profunda femoral, popliteal and calf veins including the posterior tibial, peroneal and gastrocnemius veins when visible. The superficial great saphenous vein was also interrogated. Spectral Doppler was utilized to evaluate flow at rest and with distal augmentation maneuvers in the common femoral, femoral and popliteal veins. COMPARISON:  January 15, 2021. FINDINGS: Contralateral Common Femoral Vein: Respiratory phasicity is normal and symmetric with the symptomatic side. No evidence of thrombus. Normal compressibility. Common Femoral Vein: No evidence of thrombus. Normal compressibility, respiratory phasicity and response to augmentation. Saphenofemoral  Junction: No evidence of thrombus. Normal compressibility and flow on color Doppler imaging. Profunda Femoral Vein: No evidence of thrombus. Normal compressibility and flow on color Doppler imaging. Femoral Vein: Noncompressible with no flow consistent with occlusive thrombus. Popliteal Vein: No evidence of thrombus. Normal compressibility, respiratory phasicity and response to augmentation. Calf Veins: Posterior tibial and peroneal veins are noncompressible consistent with occlusive thrombus. Superficial Great Saphenous Vein: No evidence of thrombus. Normal compressibility. Venous Reflux:  None. Other Findings:  None. IMPRESSION: Occlusive deep venous thrombosis is noted in left femoral, posterior tibial and peroneal veins. Electronically Signed   By: Marijo Conception M.D.   On: 02/03/2021 14:08   ECHOCARDIOGRAM COMPLETE  Result Date: 02/12/2021    ECHOCARDIOGRAM REPORT   Patient Name:   Angie Rojas Date of Exam: 02/12/2021 Medical Rec #:  [id number]         Height:       60.0 in Accession #:    [id number]        Weight:       120.0 lb Date of Birth:  [birth year]         BSA:          1.502 m Patient Age:    3 years          BP:           103/59 mmHg Patient Gender: F                 HR:           97 bpm. Exam Location:  Inpatient Procedure: 2D Echo Indications:    Pulmonary embolism  History:        Patient has no prior history of Echocardiogram examinations.  Sonographer:    Arlyss Gandy Referring Phys: [id number] RAVI PAHWANI IMPRESSIONS  1. Left ventricular ejection fraction, by estimation, is 60 to 65%. The left ventricle has normal function. The left ventricle has no regional wall motion abnormalities. Left ventricular diastolic parameters were normal.  2. Right ventricular systolic function is normal. The right ventricular size is normal. There is normal pulmonary artery systolic pressure.  3. The mitral valve is normal in structure. Trivial mitral valve regurgitation. No evidence of mitral stenosis.   4. The aortic valve is tricuspid. Aortic valve regurgitation is not visualized. No aortic stenosis is present.  5. The inferior vena cava is normal in size with greater than 50% respiratory variability, suggesting right atrial pressure of 3 mmHg. Comparison(s): No prior Echocardiogram. FINDINGS  Left Ventricle: Left ventricular ejection fraction, by estimation, is 60 to 65%. The left ventricle has normal function. The left ventricle has no regional wall motion abnormalities. The left ventricular internal cavity size was normal in size. There is  no left ventricular hypertrophy. Left ventricular diastolic parameters were normal. Right Ventricle: The right ventricular size is normal. Right ventricular systolic function is normal. There is normal pulmonary artery systolic pressure. The tricuspid regurgitant velocity is 2.51 m/s, and with an assumed right atrial pressure of 3 mmHg,  the estimated right ventricular systolic pressure is 47.0 mmHg. Left Atrium: Left atrial size was normal in size. Right Atrium: Right atrial size was normal in size. Pericardium: There is no evidence of pericardial effusion. Mitral Valve: The mitral valve is normal in structure. Trivial mitral valve regurgitation. No evidence of mitral valve stenosis. Tricuspid Valve: The tricuspid valve is normal in structure. Tricuspid valve regurgitation is mild . No evidence of tricuspid stenosis. Aortic Valve: The aortic valve is tricuspid. Aortic valve regurgitation is not visualized. No aortic stenosis is present. Aortic valve mean gradient measures 7.0 mmHg. Aortic valve peak gradient measures 13.8 mmHg. Pulmonic Valve: The pulmonic valve was normal in structure. Pulmonic valve regurgitation is not visualized. No evidence of pulmonic stenosis. Aorta: The aortic root is normal in size and structure. Venous: The inferior vena cava is normal in size with greater than 50% respiratory variability, suggesting right atrial pressure of 3 mmHg. IAS/Shunts: No  atrial level shunt detected by color flow Doppler.  LEFT VENTRICLE PLAX 2D LVIDd:         3.80 cm   Diastology LVIDs:         2.70 cm   LV e' medial:    11.00 cm/s LV PW:         0.80 cm   LV E/e' medial:  9.6 LV IVS:        0.80 cm   LV e' lateral:   11.00 cm/s LVOT diam:     1.80 cm   LV E/e' lateral: 9.6 LV SV:         69 LV SV Index:   46 LVOT Area:     2.54 cm  RIGHT VENTRICLE             IVC RV Basal diam:  3.20 cm     IVC diam: 1.50 cm RV Mid diam:    2.50 cm RV S prime:     15.40 cm/s TAPSE (M-mode): 2.1 cm LEFT ATRIUM             Index        RIGHT ATRIUM           Index LA diam:        2.90 cm 1.93 cm/m   RA Area:     11.40 cm LA Vol (A2C):   30.4 ml 20.24 ml/m  RA Volume:   23.70 ml  15.78 ml/m LA Vol (A4C):   50.8 ml 33.81 ml/m LA Biplane Vol: 40.8 ml 27.16 ml/m  AORTIC VALVE AV Area (Vmax):    1.67 cm AV Area (Vmean):   1.78 cm AV Area (VTI):     1.89 cm AV Vmax:           186.00 cm/s AV Vmean:          120.000 cm/s AV VTI:            0.363 m AV Peak Grad:      13.8 mmHg AV Mean Grad:      7.0 mmHg LVOT Vmax:         122.00 cm/s LVOT Vmean:        83.900 cm/s LVOT VTI:  0.270 m LVOT/AV VTI ratio: 0.74  AORTA Ao Root diam: 2.60 cm Ao Asc diam:  2.60 cm MITRAL VALVE                TRICUSPID VALVE MV Area (PHT): 2.79 cm     TR Peak grad:   25.2 mmHg MV Decel Time: 272 msec     TR Vmax:        251.00 cm/s MV E velocity: 106.00 cm/s MV Rojas velocity: 94.70 cm/s   SHUNTS MV E/Rojas ratio:  1.12         Systemic VTI:  0.27 m                             Systemic Diam: 1.80 cm Kirk Ruths MD Electronically signed by Kirk Ruths MD Signature Date/Time: 02/12/2021/11:40:05 AM    Final     Labs:  CBC: Recent Labs    02/15/21 0144 02/16/21 0147 02/17/21 0110 02/19/21 1517  WBC 18.7* 16.7* 20.8* 26.4*  HGB 8.7* 8.7* 8.6* 8.8*  HCT 26.7* 27.9* 27.8* 28.2*  PLT 479* 496* 508* 302    COAGS: Recent Labs    02/12/21 0325 02/12/21 1518 02/13/21 0143 02/14/21 0340  APTT 37* 51* 85*  62*    BMP: Recent Labs    02/13/21 0143 02/16/21 0147 02/17/21 0110 02/19/21 1517  NA 137 135 136 132*  K 4.1 4.0 4.2 4.3  CL 104 102 101 96*  CO2 26 21* 27 30  GLUCOSE 121* 119* 141* 117*  BUN 5* 5* 9 17  CALCIUM 8.6* 8.5* 8.8* 9.9  CREATININE 0.54 0.51 0.55 0.55  GFRNONAA >60 >60 >60 >60    LIVER FUNCTION TESTS: Recent Labs    02/11/21 1324 02/12/21 0326 02/19/21 1517  BILITOT 0.9 0.6 1.0  AST 61* 56* 66*  ALT 59* 50* 54*  ALKPHOS 413* 334* 483*  PROT 6.8 5.6* 6.5  ALBUMIN 2.7* 2.2* 3.0*    TUMOR MARKERS: No results for input(s): AFPTM, CEA, CA199, CHROMGRNA in the last 8760 hours.  Assessment and Plan: Pt has no significant PMH. She presented to ED 02/11/21 with acute PE and was diagnosed with metastatic pancreatic adenocarcinoma. Dr. Lonell Rojas has referred pt to IR for port-Rojas-catheter placement so she may start chemotherapy.   Pt resting on stretcher. She is Rojas&O, calm and pleasant.  She is in no distress. Pt states she had Rojas bite of cottage cheese with her meds prior to 8am this morning but nothing after.  Pt takes Eliquis for PE but did not take today.   Risks and benefits of image guided port-Rojas-catheter placement was discussed with the patient including, but not limited to bleeding, infection, pneumothorax, or fibrin sheath development and need for additional procedures.  All of the patient's questions were answered, patient is agreeable to proceed. Consent signed and in chart.   Thank you for this interesting consult.  I greatly enjoyed meeting Angie Rojas and look forward to participating in their care.  Rojas copy of this report was sent to the requesting provider on this date.  Electronically Signed: Tyson Alias, NP 02/24/2021, 1:33 PM   I spent Rojas total of 20 minutes in Rojas to Rojas in clinical consultation, greater than 50% of which was counseling/coordinating care for port-Rojas-catheter placement.

## 2021-02-24 NOTE — Procedures (Signed)
Interventional Radiology Procedure Note  Procedure: RT IJ POWER PORT    Complications: None  Estimated Blood Loss:  MIN  Findings: TIP SVCRA    M. TREVOR Yoceline Bazar, MD    

## 2021-02-24 NOTE — Progress Notes (Signed)
Per Manuela Schwartz, NP, patient to resume Eliquis tomorrow (2/1) evening. Pt and pt's family informed.

## 2021-02-25 ENCOUNTER — Inpatient Hospital Stay: Payer: Medicare Other

## 2021-02-25 ENCOUNTER — Other Ambulatory Visit: Payer: Self-pay | Admitting: Hematology and Oncology

## 2021-02-25 ENCOUNTER — Other Ambulatory Visit (HOSPITAL_BASED_OUTPATIENT_CLINIC_OR_DEPARTMENT_OTHER): Payer: Self-pay

## 2021-02-25 ENCOUNTER — Inpatient Hospital Stay: Payer: Medicare Other | Attending: Hematology and Oncology

## 2021-02-25 ENCOUNTER — Encounter: Payer: Self-pay | Admitting: Hematology and Oncology

## 2021-02-25 ENCOUNTER — Other Ambulatory Visit (HOSPITAL_COMMUNITY): Payer: Self-pay

## 2021-02-25 ENCOUNTER — Other Ambulatory Visit: Payer: Self-pay | Admitting: *Deleted

## 2021-02-25 ENCOUNTER — Encounter: Payer: Self-pay | Admitting: Nurse Practitioner

## 2021-02-25 ENCOUNTER — Telehealth: Payer: Self-pay

## 2021-02-25 ENCOUNTER — Inpatient Hospital Stay (HOSPITAL_BASED_OUTPATIENT_CLINIC_OR_DEPARTMENT_OTHER): Payer: Medicare Other | Admitting: Nurse Practitioner

## 2021-02-25 ENCOUNTER — Other Ambulatory Visit: Payer: Self-pay

## 2021-02-25 VITALS — BP 92/57 | HR 110 | Temp 98.2°F | Resp 18 | Wt 132.0 lb

## 2021-02-25 VITALS — BP 103/57 | HR 98 | Resp 18

## 2021-02-25 DIAGNOSIS — Z79899 Other long term (current) drug therapy: Secondary | ICD-10-CM | POA: Insufficient documentation

## 2021-02-25 DIAGNOSIS — Z515 Encounter for palliative care: Secondary | ICD-10-CM | POA: Diagnosis not present

## 2021-02-25 DIAGNOSIS — C259 Malignant neoplasm of pancreas, unspecified: Secondary | ICD-10-CM | POA: Diagnosis not present

## 2021-02-25 DIAGNOSIS — Z5111 Encounter for antineoplastic chemotherapy: Secondary | ICD-10-CM | POA: Insufficient documentation

## 2021-02-25 DIAGNOSIS — G893 Neoplasm related pain (acute) (chronic): Secondary | ICD-10-CM

## 2021-02-25 DIAGNOSIS — C787 Secondary malignant neoplasm of liver and intrahepatic bile duct: Secondary | ICD-10-CM | POA: Diagnosis not present

## 2021-02-25 DIAGNOSIS — Z7189 Other specified counseling: Secondary | ICD-10-CM

## 2021-02-25 DIAGNOSIS — K5903 Drug induced constipation: Secondary | ICD-10-CM

## 2021-02-25 DIAGNOSIS — R531 Weakness: Secondary | ICD-10-CM | POA: Diagnosis not present

## 2021-02-25 DIAGNOSIS — D649 Anemia, unspecified: Secondary | ICD-10-CM | POA: Diagnosis not present

## 2021-02-25 DIAGNOSIS — R530 Neoplastic (malignant) related fatigue: Secondary | ICD-10-CM

## 2021-02-25 DIAGNOSIS — D696 Thrombocytopenia, unspecified: Secondary | ICD-10-CM | POA: Insufficient documentation

## 2021-02-25 DIAGNOSIS — R634 Abnormal weight loss: Secondary | ICD-10-CM | POA: Diagnosis not present

## 2021-02-25 LAB — CBC WITH DIFFERENTIAL (CANCER CENTER ONLY)
Abs Immature Granulocytes: 0.71 10*3/uL — ABNORMAL HIGH (ref 0.00–0.07)
Basophils Absolute: 0.1 10*3/uL (ref 0.0–0.1)
Basophils Relative: 0 %
Eosinophils Absolute: 0 10*3/uL (ref 0.0–0.5)
Eosinophils Relative: 0 %
HCT: 21.6 % — ABNORMAL LOW (ref 36.0–46.0)
Hemoglobin: 6.9 g/dL — CL (ref 12.0–15.0)
Immature Granulocytes: 3 %
Lymphocytes Relative: 9 %
Lymphs Abs: 2 10*3/uL (ref 0.7–4.0)
MCH: 27.8 pg (ref 26.0–34.0)
MCHC: 31.9 g/dL (ref 30.0–36.0)
MCV: 87.1 fL (ref 80.0–100.0)
Monocytes Absolute: 2 10*3/uL — ABNORMAL HIGH (ref 0.1–1.0)
Monocytes Relative: 9 %
Neutro Abs: 16.5 10*3/uL — ABNORMAL HIGH (ref 1.7–7.7)
Neutrophils Relative %: 79 %
Platelet Count: 86 10*3/uL — ABNORMAL LOW (ref 150–400)
RBC: 2.48 MIL/uL — ABNORMAL LOW (ref 3.87–5.11)
RDW: 16.1 % — ABNORMAL HIGH (ref 11.5–15.5)
Smear Review: NORMAL
WBC Count: 21.2 10*3/uL — ABNORMAL HIGH (ref 4.0–10.5)
nRBC: 0 % (ref 0.0–0.2)

## 2021-02-25 LAB — CBC WITH DIFFERENTIAL/PLATELET
Abs Immature Granulocytes: 0.6 10*3/uL — ABNORMAL HIGH (ref 0.00–0.07)
Basophils Absolute: 0.1 10*3/uL (ref 0.0–0.1)
Basophils Relative: 0 %
Eosinophils Absolute: 0 10*3/uL (ref 0.0–0.5)
Eosinophils Relative: 0 %
HCT: 24.8 % — ABNORMAL LOW (ref 36.0–46.0)
Hemoglobin: 8.1 g/dL — ABNORMAL LOW (ref 12.0–15.0)
Immature Granulocytes: 3 %
Lymphocytes Relative: 7 %
Lymphs Abs: 1.7 10*3/uL (ref 0.7–4.0)
MCH: 28.4 pg (ref 26.0–34.0)
MCHC: 32.7 g/dL (ref 30.0–36.0)
MCV: 87 fL (ref 80.0–100.0)
Monocytes Absolute: 1.9 10*3/uL — ABNORMAL HIGH (ref 0.1–1.0)
Monocytes Relative: 8 %
Neutro Abs: 19.4 10*3/uL — ABNORMAL HIGH (ref 1.7–7.7)
Neutrophils Relative %: 82 %
Platelets: 74 10*3/uL — ABNORMAL LOW (ref 150–400)
RBC: 2.85 MIL/uL — ABNORMAL LOW (ref 3.87–5.11)
RDW: 15.9 % — ABNORMAL HIGH (ref 11.5–15.5)
WBC: 23.7 10*3/uL — ABNORMAL HIGH (ref 4.0–10.5)
nRBC: 0 % (ref 0.0–0.2)

## 2021-02-25 LAB — COMPREHENSIVE METABOLIC PANEL
ALT: 40 U/L (ref 0–44)
AST: 61 U/L — ABNORMAL HIGH (ref 15–41)
Albumin: 2.6 g/dL — ABNORMAL LOW (ref 3.5–5.0)
Alkaline Phosphatase: 444 U/L — ABNORMAL HIGH (ref 38–126)
Anion gap: 6 (ref 5–15)
BUN: 13 mg/dL (ref 8–23)
CO2: 28 mmol/L (ref 22–32)
Calcium: 8.7 mg/dL — ABNORMAL LOW (ref 8.9–10.3)
Chloride: 96 mmol/L — ABNORMAL LOW (ref 98–111)
Creatinine, Ser: 0.51 mg/dL (ref 0.44–1.00)
GFR, Estimated: >60 mL/min (ref >60)
Glucose, Bld: 135 mg/dL — ABNORMAL HIGH (ref 70–99)
Potassium: 3.8 mmol/L (ref 3.5–5.1)
Sodium: 130 mmol/L — ABNORMAL LOW (ref 135–145)
Total Bilirubin: 1.4 mg/dL — ABNORMAL HIGH (ref 0.3–1.2)
Total Protein: 6 g/dL — ABNORMAL LOW (ref 6.5–8.1)

## 2021-02-25 LAB — ABO/RH: ABO/RH(D): A NEG

## 2021-02-25 LAB — SAMPLE TO BLOOD BANK

## 2021-02-25 LAB — PREPARE RBC (CROSSMATCH)

## 2021-02-25 MED ORDER — HEPARIN SOD (PORK) LOCK FLUSH 100 UNIT/ML IV SOLN
500.0000 [IU] | Freq: Once | INTRAVENOUS | Status: AC | PRN
  Administered 2021-02-25: 500 [IU]

## 2021-02-25 MED ORDER — SODIUM CHLORIDE 0.9 % IV SOLN
750.0000 mg/m2 | Freq: Once | INTRAVENOUS | Status: AC
  Administered 2021-02-25: 1216 mg via INTRAVENOUS
  Filled 2021-02-25: qty 31.98

## 2021-02-25 MED ORDER — SODIUM CHLORIDE 0.9 % IV SOLN: Freq: Once | INTRAVENOUS | Status: AC

## 2021-02-25 MED ORDER — OXYCODONE HCL ER 15 MG PO T12A
15.0000 mg | EXTENDED_RELEASE_TABLET | Freq: Two times a day (BID) | ORAL | 0 refills | Status: DC
Start: 2021-02-25 — End: 2022-07-21
  Filled 2021-02-25 (×2): qty 60, 30d supply, fill #0
  Filled 2021-02-25: qty 40, 20d supply, fill #0

## 2021-02-25 MED ORDER — PROCHLORPERAZINE MALEATE 10 MG PO TABS
10.0000 mg | ORAL_TABLET | Freq: Once | ORAL | Status: AC
  Administered 2021-02-25: 10 mg via ORAL
  Filled 2021-02-25: qty 1

## 2021-02-25 MED ORDER — OXYCODONE HCL ER 15 MG PO T12A: 15.0000 mg | EXTENDED_RELEASE_TABLET | Freq: Two times a day (BID) | ORAL | 0 refills | Status: DC

## 2021-02-25 MED ORDER — SODIUM CHLORIDE 0.9% FLUSH
10.0000 mL | INTRAVENOUS | Status: DC | PRN
  Administered 2021-02-25: 10 mL

## 2021-02-25 MED ORDER — DOCUSATE SODIUM 100 MG PO CAPS: 100.0000 mg | ORAL_CAPSULE | Freq: Two times a day (BID) | ORAL | 2 refills | Status: DC

## 2021-02-25 NOTE — Progress Notes (Signed)
Patient is scheduled for blood transfusion tomorrow morning. Discuss about the process of blood transfusion, risks and patient agreeable to transfusion.  Angie Rojas

## 2021-02-25 NOTE — Progress Notes (Signed)
Order placed through [facility] to [place] for [facility]. Will be delivered to home.

## 2021-02-25 NOTE — Progress Notes (Signed)
[facility]  [phone]) [phone] [phone]) [phone]   Name: Angie Rojas Date: 02/25/2021 MRN: [id number]  DOB: 1946/01/10  Patient Care Team: Lajean Manes, MD as PCP - General (Internal Medicine)    REASON FOR CONSULTATION: Angie Rojas is a 76 y.o. 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 Palliative ask to see for symptom management and goals of care.    SOCIAL HISTORY:     reports that she has never smoked. She has never used smokeless tobacco. She reports that she does not drink alcohol.  ADVANCE DIRECTIVES:  Patient has an active advance directive on file. Her sons Iona Beard and Juanda Crumble are her designated medical decision makers. She would not want life-sustaining measures per documents in the case of terminal illness.   CODE STATUS:   PAST MEDICAL HISTORY: Past Medical History:  Diagnosis Date   DVT (deep venous thrombosis) (HCC)    LLE DVT Jan 2023    PAST SURGICAL HISTORY:  Past Surgical History:  Procedure Laterality Date   IR IMAGING GUIDED PORT INSERTION  02/24/2021    HEMATOLOGY/ONCOLOGY HISTORY:  Oncology History  Adenocarcinoma of pancreas, stage 4 ([place])  02/11/2021 Initial Diagnosis   Adenocarcinoma of pancreas, stage 4 ([place])   02/25/2021 -  Chemotherapy   Patient is on Treatment Plan : PANCREATIC Abraxane / Gemcitabine D1,8,15 q28d       ALLERGIES:  has No Known Allergies.  MEDICATIONS:  Current Outpatient Medications  Medication Sig Dispense Refill   ALPRAZolam (XANAX) 0.5 MG tablet Take 1 tablet (0.5 mg total) by mouth 3 (three) times daily as needed for anxiety. 30 tablet 0   apixaban (ELIQUIS) 5 MG TABS tablet Take 1 tablet (5 mg total) by mouth 2 (two) times daily. 60 tablet 3   bisacodyl (DULCOLAX) 10 MG suppository Place 1 suppository (10  mg total) rectally as needed for moderate constipation. 30 suppository 0   Carboxymethylcellulose Sodium (THERATEARS OP) Place 1 drop into both eyes daily as needed (dry eyes).     docusate sodium (COLACE) 100 MG capsule Take 1 capsule (100 mg total) by mouth 2 (two) times daily. 60 capsule 2   lidocaine-prilocaine (EMLA) cream Apply to affected area once 30 g 3   lipase/protease/amylase (CREON) 12000-38000 units CPEP capsule Take 1 capsule (12,000 Units total) by mouth 3 (three) times daily before meals. 270 capsule 0   Multiple Vitamin (MULTIVITAMIN WITH MINERALS) TABS tablet Take 1 tablet by mouth daily.     ondansetron (ZOFRAN) 4 MG tablet Take 1 tablet (4 mg total) by mouth every 6 (six) hours as needed for nausea. 20 tablet 0   ondansetron (ZOFRAN) 8 MG tablet Take 1 tablet (8 mg total) by mouth 2 (two) times daily as needed (Nausea or vomiting). 30 tablet 1   OVER THE COUNTER MEDICATION Take 7.5 mLs by mouth 2 (two) times daily. CBD oil liquid     oxyCODONE (OXY IR/ROXICODONE) 5 MG immediate release tablet Take 1-2 tablets (5-10 mg total) by mouth every 4 (four) hours as needed (mild pain). 30 tablet 0   oxyCODONE (OXYCONTIN) 15 mg 12 hr tablet Take 1 tablet (15 mg total) by mouth every 12 (twelve) hours. 60 tablet 0   polyethylene glycol (MIRALAX / GLYCOLAX) 17 g packet Take 17 g by mouth 2 (two) times daily. (Patient taking differently: Take 17 g by mouth 2 (  two) times daily as needed for moderate constipation.) 14 each 0   prochlorperazine (COMPAZINE) 10 MG tablet Take 1 tablet (10 mg total) by mouth every 6 (six) hours as needed (Nausea or vomiting). 30 tablet 1   senna (SENOKOT) 8.6 MG TABS tablet Take 1 tablet (8.6 mg total) by mouth daily. 120 tablet 0   sertraline (ZOLOFT) 25 MG tablet Take 1 tablet (25 mg total) by mouth daily. 30 tablet 1   No current facility-administered medications for this visit.   Facility-Administered Medications Ordered in Other Visits  Medication Dose  Route Frequency Provider Last Rate Last Admin   sodium chloride flush (NS) 0.9 % injection 10 mL  10 mL Intracatheter PRN Iruku, Praveena, MD   10 mL at 02/25/21 1430    VITAL SIGNS: BP (!) 92/57 (BP Location: Left Arm, Patient Position: Sitting)    Pulse (!) 110    Temp 98.2 F (36.8 C) (Oral)    Resp 18    Wt 132 lb (59.9 kg)    SpO2 100%    BMI 25.78 kg/m  Filed Weights   02/25/21 1020  Weight: 132 lb (59.9 kg)    Estimated body mass index is 25.78 kg/m as calculated from the following:   Height as of 02/24/21: 5' (1.524 m).   Weight as of this encounter: 132 lb (59.9 kg).  LABS: CBC:    Component Value Date/Time   WBC 21.2 (H) 02/25/2021 1315   WBC 23.7 (H) 02/25/2021 0951   HGB 6.9 (LL) 02/25/2021 1315   HGB 8.1 (L) 02/25/2021 0951   HCT 21.6 (L) 02/25/2021 1315   PLT 86 (L) 02/25/2021 1315   PLT 74 (L) 02/25/2021 0951   MCV 87.1 02/25/2021 1315   NEUTROABS 16.5 (H) 02/25/2021 1315   LYMPHSABS 2.0 02/25/2021 1315   MONOABS 2.0 (H) 02/25/2021 1315   EOSABS 0.0 02/25/2021 1315   BASOSABS 0.1 02/25/2021 1315   Comprehensive Metabolic Panel:    Component Value Date/Time   NA 130 (L) 02/25/2021 0951   K 3.8 02/25/2021 0951   CL 96 (L) 02/25/2021 0951   CO2 28 02/25/2021 0951   BUN 13 02/25/2021 0951   CREATININE 0.51 02/25/2021 0951   GLUCOSE 135 (H) 02/25/2021 0951   CALCIUM 8.7 (L) 02/25/2021 0951   AST 61 (H) 02/25/2021 0951   ALT 40 02/25/2021 0951   ALKPHOS 444 (H) 02/25/2021 0951   BILITOT 1.4 (H) 02/25/2021 0951   PROT 6.0 (L) 02/25/2021 0951   ALBUMIN 2.6 (L) 02/25/2021 0951    RADIOGRAPHIC STUDIES: DG Chest 2 View  Result Date: 02/11/2021 CLINICAL DATA:  Shortness of breath and vomiting.  DVT. EXAM: CHEST - 2 VIEW COMPARISON:  None. FINDINGS: Trachea is midline. Heart size normal. Lungs are clear. No pleural fluid. IMPRESSION: No acute findings. Electronically Signed   By: Lorin Picket M.D.   On: 02/11/2021 13:50   CT Head Wo Contrast  Result  Date: 02/11/2021 CLINICAL DATA:  Mental status change.  Rule out metastatic disease EXAM: CT HEAD WITHOUT CONTRAST TECHNIQUE: Contiguous axial images were obtained from the base of the skull through the vertex without intravenous contrast. RADIATION DOSE REDUCTION: This exam was performed according to the departmental dose-optimization program which includes automated exposure control, adjustment of the mA and/or kV according to patient size and/or use of iterative reconstruction technique. COMPARISON:  MRI head 07/03/2013 FINDINGS: Brain: No evidence of acute infarction, hemorrhage, hydrocephalus, extra-axial collection or mass lesion/mass effect. Vascular: Negative for hyperdense vessel Skull:  Negative Sinuses/Orbits: Paranasal sinuses clear.  Negative orbit Other: None IMPRESSION: Negative CT head Electronically Signed   By: Franchot Gallo M.D.   On: 02/11/2021 18:07   CT Angio Chest PE W and/or Wo Contrast  Result Date: 02/11/2021 CLINICAL DATA:  Shortness of breath, history of DVT in the left lower extremity EXAM: CT ANGIOGRAPHY CHEST WITH CONTRAST TECHNIQUE: Multidetector CT imaging of the chest was performed using the standard protocol during bolus administration of intravenous contrast. Multiplanar CT image reconstructions and MIPs were obtained to evaluate the vascular anatomy. RADIATION DOSE REDUCTION: This exam was performed according to the departmental dose-optimization program which includes automated exposure control, adjustment of the mA and/or kV according to patient size and/or use of iterative reconstruction technique. CONTRAST:  125mL OMNIPAQUE IOHEXOL 350 MG/ML SOLN COMPARISON:  None. FINDINGS: Cardiovascular: Contrast density in the pulmonary artery branches is less than optimal. There is more contrast enhancement in the systemic vessels suggesting less than optimal timing of the imaging sequence. As far as seen, there are no filling defects in central pulmonary artery branches. There are  few intraluminal filling defects in the segmental and subsegmental branches in the right lower lung fields with small thrombus burden. There is no ectasia of main pulmonary artery. Right ventricular cavity is more prominent than usual suggesting possible right ventricular strain. Midportion of right ventricular cavity measures 3.7 cm in diameter. Mediastinum/Nodes: No significant lymphadenopathy seen. Lungs/Pleura: There is no focal pulmonary consolidation. There is no pleural effusion or pneumothorax. Small linear densities in the lower lung fields suggest scarring or subsegmental atelectasis. Upper Abdomen: There are numerous space-occupying lesions of varying sizes in the visualized upper portions of liver measuring up to 5.3 cm in diameter. There is fatty infiltration in the liver. Musculoskeletal: Unremarkable. Review of the MIP images confirms the above findings. IMPRESSION: Acute pulmonary embolism in few segmental and subsegmental branches in the right lower lobe. Small thrombus burden. There is dilation of right ventricular cavity suggesting possible right ventricular strain. There is no evidence of thoracic aortic dissection. There is no focal pulmonary consolidation. There are numerous space-occupying lesions of varying sizes and varying attenuation in the liver suggesting extensive hepatic metastatic disease. Fatty infiltration is seen in the liver. Electronically Signed   By: Elmer Picker M.D.   On: 02/11/2021 16:02   CT ABDOMEN PELVIS W CONTRAST  Result Date: 02/11/2021 CLINICAL DATA:  Abdominal pain. EXAM: CT ABDOMEN AND PELVIS WITH CONTRAST TECHNIQUE: Multidetector CT imaging of the abdomen and pelvis was performed using the standard protocol following bolus administration of intravenous contrast. RADIATION DOSE REDUCTION: This exam was performed according to the departmental dose-optimization program which includes automated exposure control, adjustment of the mA and/or kV according to  patient size and/or use of iterative reconstruction technique. CONTRAST:  110mL OMNIPAQUE IOHEXOL 350 MG/ML SOLN COMPARISON:  None. FINDINGS: Lower chest: Free motion artifact but no pulmonary lesions or pleural effusions. 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 Largest lesion in the left hepatic lobe on image number 18/3 measures 4.4 cm. Index lesion in the right hepatic lobe on image 35/3 measures 4.1 cm. The gallbladder is grossly normal.  No common bile duct dilatation. Pancreas: There is a 2.6 cm lesion in the pancreatic tail which could be the primary neoplasm. There is also a small lesion in the body tail junction region on image 32/3 which measures 10 mm. Spleen: Normal size.  No splenic lesions. Adrenals/Urinary Tract: No adrenal gland lesions. Small renal cysts.  No worrisome renal lesions. The bladder is unremarkable. Stomach/Bowel: The stomach, duodenum, small bowel and terminal ileum are grossly normal. No acute inflammatory process, mass lesions or obstructive findings. No obvious colonic lesions to suggest a colonic primary. The appendix is normal. Vascular/Lymphatic: The aorta and branch vessels are patent. Scattered atherosclerotic calcifications. 2.2 cm the celiac axis lymph node on image 30/3. There are scattered borderline retroperitoneal lymph nodes without overt adenopathy. Nodular mesenteric implant on image 55/3 measures 2.1 cm. Mesenteric implant on image 57/3 measures 18 mm. Reproductive: Calcified right-sided fibroid. There is also a E 3.7 cm mass which could be a partially degenerated fibroid. Could not exclude the possibility of endometrial cancer. Pelvic ultrasound or MRI pelvis may be helpful for further evaluation. No adnexal masses. Prominent gonadal veins bilaterally. Other: Small amount of free pelvic fluid is noted. No inguinal adenopathy. Musculoskeletal: No significant bony findings. IMPRESSION: 1. Diffuse hepatic metastatic disease.  2. 2.6 cm lesion in the pancreatic tail could be the primary neoplasm. However there is a second lesion in the pancreatic body/tail junction region and this could reflect metastatic disease also. 3. Celiac axis lymphadenopathy and mesenteric implants. 4. Borderline retroperitoneal lymph nodes. 5. 3.7 cm mass in the uterus could be a partially degenerated fibroid. Could not exclude the possibility of any medial cancer. PET-CT may be helpful to evaluate all of the above findings and to potentially guide a safe and appropriate biopsy. Electronically Signed   By: Marijo Sanes M.D.   On: 02/11/2021 16:07   US BIOPSY (LIVER)  Result Date: 02/13/2021 INDICATION: Liver metastases, suspect pancreas primary by imaging. EXAM: ULTRASOUND CORE BIOPSY RIGHT LIVER METASTASIS MEDICATIONS: 1% LIDOCAINE LOCAL ANESTHESIA/SEDATION: Moderate (conscious) sedation was employed during this procedure. A total of Versed 1.0 mg and Fentanyl 25 mcg was administered intravenously by the radiology nurse. Total intra-service moderate Sedation Time: 7 minutes. The patient's level of consciousness and vital signs were monitored continuously by radiology nursing throughout the procedure under my direct supervision. FLUOROSCOPY TIME:  Fluoroscopy Time: None. COMPLICATIONS: None immediate. PROCEDURE: Informed written consent was obtained from the patient after a thorough discussion of the procedural risks, benefits and alternatives. All questions were addressed. Maximal Sterile Barrier Technique was utilized including caps, mask, sterile gowns, sterile gloves, sterile drape, hand hygiene and skin antiseptic. A timeout was performed prior to the initiation of the procedure. Previous imaging reviewed. Preliminary ultrasound performed. A right hepatic lesion was localized in the mid axillary line through a lower intercostal space. Overlying skin marked. Under sterile conditions and local anesthesia, a 17 gauge coaxial guide needle was advanced into  the lesion. Needle position confirmed with ultrasound. 18 gauge core biopsies obtained. Samples placed in formalin. Needle tract occluded with Gel-Foam. Postprocedure imaging demonstrates no hemorrhage or hematoma. Patient tolerated biopsy well. IMPRESSION: Successful ultrasound core biopsy of the right liver metastasis. Electronically Signed   By: Jerilynn Mages.  Shick M.D.   On: 02/13/2021 15:10   US Venous Img Lower Unilateral Left (DVT)  Result Date: 02/03/2021 CLINICAL DATA:  Left lower extremity pain. EXAM: Left LOWER EXTREMITY VENOUS DOPPLER ULTRASOUND TECHNIQUE: Gray-scale sonography with graded compression, as well as color Doppler and duplex ultrasound were performed to evaluate the lower extremity deep venous systems from the level of the common femoral vein and including the common femoral, femoral, profunda femoral, popliteal and calf veins including the posterior tibial, peroneal and gastrocnemius veins when visible. The superficial great saphenous vein was also interrogated. Spectral Doppler was utilized to evaluate flow at rest  and with distal augmentation maneuvers in the common femoral, femoral and popliteal veins. COMPARISON:  January 15, 2021. FINDINGS: Contralateral Common Femoral Vein: Respiratory phasicity is normal and symmetric with the symptomatic side. No evidence of thrombus. Normal compressibility. Common Femoral Vein: No evidence of thrombus. Normal compressibility, respiratory phasicity and response to augmentation. Saphenofemoral Junction: No evidence of thrombus. Normal compressibility and flow on color Doppler imaging. Profunda Femoral Vein: No evidence of thrombus. Normal compressibility and flow on color Doppler imaging. Femoral Vein: Noncompressible with no flow consistent with occlusive thrombus. Popliteal Vein: No evidence of thrombus. Normal compressibility, respiratory phasicity and response to augmentation. Calf Veins: Posterior tibial and peroneal veins are noncompressible  consistent with occlusive thrombus. Superficial Great Saphenous Vein: No evidence of thrombus. Normal compressibility. Venous Reflux:  None. Other Findings:  None. IMPRESSION: Occlusive deep venous thrombosis is noted in left femoral, posterior tibial and peroneal veins. Electronically Signed   By: Marijo Conception M.D.   On: 02/03/2021 14:08   ECHOCARDIOGRAM COMPLETE  Result Date: 02/12/2021    ECHOCARDIOGRAM REPORT   Patient Name:   Angie Rojas Date of Exam: 02/12/2021 Medical Rec #:  [id number]         Height:       60.0 in Accession #:    [id number]        Weight:       120.0 lb Date of Birth:  03-25-1945         BSA:          1.502 m Patient Age:    37 years          BP:           103/59 mmHg Patient Gender: F                 HR:           97 bpm. Exam Location:  Inpatient Procedure: 2D Echo Indications:    Pulmonary embolism  History:        Patient has no prior history of Echocardiogram examinations.  Sonographer:    Arlyss Gandy Referring Phys: [id number] RAVI PAHWANI IMPRESSIONS  1. Left ventricular ejection fraction, by estimation, is 60 to 65%. The left ventricle has normal function. The left ventricle has no regional wall motion abnormalities. Left ventricular diastolic parameters were normal.  2. Right ventricular systolic function is normal. The right ventricular size is normal. There is normal pulmonary artery systolic pressure.  3. The mitral valve is normal in structure. Trivial mitral valve regurgitation. No evidence of mitral stenosis.  4. The aortic valve is tricuspid. Aortic valve regurgitation is not visualized. No aortic stenosis is present.  5. The inferior vena cava is normal in size with greater than 50% respiratory variability, suggesting right atrial pressure of 3 mmHg. Comparison(s): No prior Echocardiogram. FINDINGS  Left Ventricle: Left ventricular ejection fraction, by estimation, is 60 to 65%. The left ventricle has normal function. The left ventricle has no regional wall motion  abnormalities. The left ventricular internal cavity size was normal in size. There is  no left ventricular hypertrophy. Left ventricular diastolic parameters were normal. Right Ventricle: The right ventricular size is normal. Right ventricular systolic function is normal. There is normal pulmonary artery systolic pressure. The tricuspid regurgitant velocity is 2.51 m/s, and with an assumed right atrial pressure of 3 mmHg,  the estimated right ventricular systolic pressure is 81.4 mmHg. Left Atrium: Left atrial size was normal in size. Right Atrium: Right atrial size  was normal in size. Pericardium: There is no evidence of pericardial effusion. Mitral Valve: The mitral valve is normal in structure. Trivial mitral valve regurgitation. No evidence of mitral valve stenosis. Tricuspid Valve: The tricuspid valve is normal in structure. Tricuspid valve regurgitation is mild . No evidence of tricuspid stenosis. Aortic Valve: The aortic valve is tricuspid. Aortic valve regurgitation is not visualized. No aortic stenosis is present. Aortic valve mean gradient measures 7.0 mmHg. Aortic valve peak gradient measures 13.8 mmHg. Pulmonic Valve: The pulmonic valve was normal in structure. Pulmonic valve regurgitation is not visualized. No evidence of pulmonic stenosis. Aorta: The aortic root is normal in size and structure. Venous: The inferior vena cava is normal in size with greater than 50% respiratory variability, suggesting right atrial pressure of 3 mmHg. IAS/Shunts: No atrial level shunt detected by color flow Doppler.  LEFT VENTRICLE PLAX 2D LVIDd:         3.80 cm   Diastology LVIDs:         2.70 cm   LV e' medial:    11.00 cm/s LV PW:         0.80 cm   LV E/e' medial:  9.6 LV IVS:        0.80 cm   LV e' lateral:   11.00 cm/s LVOT diam:     1.80 cm   LV E/e' lateral: 9.6 LV SV:         69 LV SV Index:   46 LVOT Area:     2.54 cm  RIGHT VENTRICLE             IVC RV Basal diam:  3.20 cm     IVC diam: 1.50 cm RV Mid diam:     2.50 cm RV S prime:     15.40 cm/s TAPSE (M-mode): 2.1 cm LEFT ATRIUM             Index        RIGHT ATRIUM           Index LA diam:        2.90 cm 1.93 cm/m   RA Area:     11.40 cm LA Vol (A2C):   30.4 ml 20.24 ml/m  RA Volume:   23.70 ml  15.78 ml/m LA Vol (A4C):   50.8 ml 33.81 ml/m LA Biplane Vol: 40.8 ml 27.16 ml/m  AORTIC VALVE AV Area (Vmax):    1.67 cm AV Area (Vmean):   1.78 cm AV Area (VTI):     1.89 cm AV Vmax:           186.00 cm/s AV Vmean:          120.000 cm/s AV VTI:            0.363 m AV Peak Grad:      13.8 mmHg AV Mean Grad:      7.0 mmHg LVOT Vmax:         122.00 cm/s LVOT Vmean:        83.900 cm/s LVOT VTI:          0.270 m LVOT/AV VTI ratio: 0.74  AORTA Ao Root diam: 2.60 cm Ao Asc diam:  2.60 cm MITRAL VALVE                TRICUSPID VALVE MV Area (PHT): 2.79 cm     TR Peak grad:   25.2 mmHg MV Decel Time: 272 msec     TR Vmax:  251.00 cm/s MV E velocity: 106.00 cm/s MV A velocity: 94.70 cm/s   SHUNTS MV E/A ratio:  1.12         Systemic VTI:  0.27 m                             Systemic Diam: 1.80 cm Kirk Ruths MD Electronically signed by Kirk Ruths MD Signature Date/Time: 02/12/2021/11:40:05 AM    Final    IR IMAGING GUIDED PORT INSERTION  Result Date: 02/24/2021 CLINICAL DATA:  Metastatic pancreas cancer, access for chemotherapy EXAM: RIGHT INTERNAL JUGULAR SINGLE LUMEN POWER PORT CATHETER INSERTION Date:  02/24/2021 02/24/2021 2:36 pm Radiologist:  Jerilynn Mages. Daryll Brod, MD Guidance:  Ultrasound and fluoroscopic MEDICATIONS: 1% lidocaine local with epinephrine ANESTHESIA/SEDATION: Versed 2.0 mg IV; Fentanyl 100 mcg IV; Moderate Sedation Time:  29 minute The patient was continuously monitored during the procedure by the interventional radiology nurse under my direct supervision. FLUOROSCOPY TIME:  1.0 minutes, 42 seconds (3 mGy) COMPLICATIONS: None immediate. CONTRAST:  None. PROCEDURE: Informed consent was obtained from the patient following explanation of the procedure,  risks, benefits and alternatives. The patient understands, agrees and consents for the procedure. All questions were addressed. A time out was performed. Maximal barrier sterile technique utilized including caps, mask, sterile gowns, sterile gloves, large sterile drape, hand hygiene, and 2% chlorhexidine scrub. Under sterile conditions and local anesthesia, right internal jugular micropuncture venous access was performed. Access was performed with ultrasound. Images were obtained for documentation of the patent right internal jugular vein. A guide wire was inserted followed by a transitional dilator. This allowed insertion of a guide wire and catheter into the IVC. Measurements were obtained from the SVC / RA junction back to the right IJ venotomy site. In the right infraclavicular chest, a subcutaneous pocket was created over the second anterior rib. This was done under sterile conditions and local anesthesia. 1% lidocaine with epinephrine was utilized for this. A 2.5 cm incision was made in the skin. Blunt dissection was performed to create a subcutaneous pocket over the right pectoralis major muscle. The pocket was flushed with saline vigorously. There was adequate hemostasis. The port catheter was assembled and checked for leakage. The port catheter was secured in the pocket with two retention sutures. The tubing was tunneled subcutaneously to the right venotomy site and inserted into the SVC/RA junction through a valved peel-away sheath. Position was confirmed with fluoroscopy. Images were obtained for documentation. The patient tolerated the procedure well. No immediate complications. Incisions were closed in a two layer fashion with 4 - 0 Vicryl suture. Dermabond was applied to the skin. The port catheter was accessed, blood was aspirated followed by saline and heparin flushes. Needle was removed. A dry sterile dressing was applied. IMPRESSION: Ultrasound and fluoroscopically guided right internal jugular  single lumen power port catheter insertion. Tip in the SVC/RA junction. Catheter ready for use. Electronically Signed   By: Jerilynn Mages.  Shick M.D.   On: 02/24/2021 14:47    PERFORMANCE STATUS (ECOG) : 2 - Symptomatic, <50% confined to bed  Review of Systems  Constitutional:  Positive for appetite change and fatigue.  Gastrointestinal:  Positive for abdominal distention and abdominal pain.  Neurological:  Positive for weakness.  Unless otherwise noted, a complete review of systems is negative.  Physical Exam General: NAD, weak, ill-appearing, in wheelchair Cardiovascular: Tachycardic, hypotensive Pulmonary: clear ant fields Abdomen: firm, tender, distended, + bowel sounds Extremities: no edema, no joint  deformities Skin: no rashes Neurological: Weakness but otherwise nonfocal  IMPRESSION:  This is my initial visit with Angie Rojas. She presents to clinic today in a wheelchair accompanied by her 2 sons Iona Beard and Angie Rojas). Weak appearing.   I introduced myself, Advertising copywriter, and Palliative's role in collaboration with the oncology team. Concept of Palliative Care was introduced as specialized medical care for people and their families living with serious illness.  It focuses on providing relief from the symptoms and stress of a serious illness.  The goal is to improve quality of life for both the patient and the family. Values and goals of care important to patient and family were attempted to be elicited.   Angie Rojas shares she has the 2 sons who are very supportive to her and offers assistance. Her son [name] is a PA in [place]. He is currently here staying with patient to assist in her care. 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   Patient requires some assistance with ADLs due to increased fatigue, pain, and ongoing weakness. She shares her appetite has been minimal although she continues to try to eat as much as possible. She has constant  pain in her abdomen and lower back.   Pain  Angie Rojas and her sons share that she has ongoing abdominal pain. She reports most of her pain is in her upper right and lower quadrant area. Her abdomen is distended and firm. She was started on Oxycontin and Oxy IR during recent admission. They feel her pain is well controlled on current regimen. We discussed ongoing use for effective pain management and closely monitoring, making adjustments as needed.   Fatigue She spends most of her time in her chair or bed due to increased fatigue and weakness. She is ambulatory in the home but tires easily. She requires some assistance with ADLs. We discussed taking frequent rest breaks when her body is tired. She understands this is a part of her disease process. She shares she has always been an active individual and is grasping how her life has suddenly changed. Support provided.   Anorexia/weight loss  Ms. Duba stats her appetite is decreased. She feels full after taking several bites although she tries to push herself. Also drinking Ensure for added nutrition. She continues to lose weight. On 1/25 she weighed 143.1 lbs, today she weighs 132lbs. She is thin appearing with some muscle wasting.   Anxiety  She is anxious in regards to her extensive cancer diagnosis and the fear of the unknown. She is also anxious in regards to treatment on today expectations with hopes of some response. She understands her disease process and trajectory however continues to express her hopes. She does have xanax available as needed.   I was able to speak with her sons privately as patient was getting situated for her infusion. Sons asked appropriate questions. We discussed the palliative intent of treatment and expectations. They understand the importance of ongoing discussions given patient's poor prognosis.   Constipation  We discussed the importance of bowel regimen in the setting of opioid use to prevent constipation and further  complications. She is taking senna-S and dulcolax as needed. Encouraged use of daily Miralax also. Sons verbalized understanding.   We discussed Her current illness and what it means in the larger context of Her on-going co-morbidities. Natural disease trajectory and expectations were discussed.  I discussed the importance of continued conversation with family and  their medical providers regarding overall plan of care and treatment options, ensuring decisions are within the context of the patients values and GOCs.  PLAN: Oxycontin 15 mg every 12 hours for pain Oxy IR 5-10 mg every 4 hours as needed for breakthrough pain.  Continue xanax as needed for anxiety.  Senna-S and Miralax daily for bowel regimen. Dulcolax suppository as needed.  Discussed importance of ongoing goals of care discussions and planning.  I will plan to see patient back in 1-2 weeks in collaboration to other oncology appointments.    Patient expressed understanding and was in agreement with this plan. She also understands that She can call the clinic at any time with any questions, concerns, or complaints.   Time Total: 45 min.   Visit consisted of counseling and education dealing with the complex and emotionally intense issues of symptom management and palliative care in the setting of serious and potentially life-threatening illness.Greater than 50%  of this time was spent counseling and coordinating care related to the above assessment and plan.  Signed by: Alda Lea, AGPCNP-BC Palliative Medicine Team

## 2021-02-25 NOTE — Telephone Encounter (Signed)
Magdalene Molly, Ms. Laduke's son, called our office to inquire about the Oxycontin prescription that they tried to pick up from [facility] He stated that it was going to cost them over $400. Our office looked into this and found that the medication needed a prior authorization. Lexine Baton, NP completed the prior authorization and the medication was able to be filled at [facility] for $172. I relayed this information to [name] who verbalized gratitude and understanding. All questions answered. Advised to call our office with any questions/concerns.

## 2021-02-25 NOTE — Progress Notes (Signed)
Labs redrawn since platelet count decreased. Ok to proceed with gemzar for now at 25% dose reduction. If platelets are low on re draw, we may have to hold off abraxance.  Mackay Hanauer

## 2021-02-25 NOTE — Progress Notes (Signed)
OK to proceed w/ Compazine and Gemzar per Dr. [name].   Rechecking Pltc before deciding re: Abraxane.  Kennith Center, Pharm.D., CPP 02/25/2021@12 :09 PM

## 2021-02-25 NOTE — Patient Instructions (Signed)
[facility]   ?Discharge Instructions: ?Thank you for choosing [facility] to provide your oncology and hematology care.  ? ?If you have a lab appointment with the Cancer Center, please go directly to the Cancer Center and check in at the registration area. ?  ?Wear comfortable clothing and clothing appropriate for easy access to any Portacath or PICC line.  ? ?We strive to give you quality time with your provider. You may need to reschedule your appointment if you arrive late (15 or more minutes).  Arriving late affects you and other patients whose appointments are after yours.  Also, if you miss three or more appointments without notifying the office, you may be dismissed from the clinic at the provider?s discretion.    ?  ?For prescription refill requests, have your pharmacy contact our office and allow 72 hours for refills to be completed.   ? ?Today you received the following chemotherapy and/or immunotherapy agents: gemcitabine    ?  ?To help prevent nausea and vomiting after your treatment, we encourage you to take your nausea medication as directed. ? ?BELOW ARE SYMPTOMS THAT SHOULD BE REPORTED IMMEDIATELY: ?*FEVER GREATER THAN 100.4 F (38 ?C) OR HIGHER ?*CHILLS OR SWEATING ?*NAUSEA AND VOMITING THAT IS NOT CONTROLLED WITH YOUR NAUSEA MEDICATION ?*UNUSUAL SHORTNESS OF BREATH ?*UNUSUAL BRUISING OR BLEEDING ?*URINARY PROBLEMS (pain or burning when urinating, or frequent urination) ?*BOWEL PROBLEMS (unusual diarrhea, constipation, pain near the anus) ?TENDERNESS IN MOUTH AND THROAT WITH OR WITHOUT PRESENCE OF ULCERS (sore throat, sores in mouth, or a toothache) ?UNUSUAL RASH, SWELLING OR PAIN  ?UNUSUAL VAGINAL DISCHARGE OR ITCHING  ? ?Items with * indicate a potential emergency and should be followed up as soon as possible or go to the Emergency Department if any problems should occur. ? ?Please show the CHEMOTHERAPY ALERT CARD or IMMUNOTHERAPY ALERT CARD at check-in  to the Emergency Department and triage nurse. ? ?Should you have questions after your visit or need to cancel or reschedule your appointment, please contact [facility]  Dept: [phone]  and follow the prompts.  Office hours are 8:00 a.m. to 4:30 p.m. Monday - Friday. Please note that voicemails left after 4:00 p.m. may not be returned until the following business day.  We are closed weekends and major holidays. You have access to a nurse at all times for urgent questions. Please call the main number to the clinic Dept: [phone] and follow the prompts. ? ? ?For any non-urgent questions, you may also contact your provider using MyChart. We now offer e-Visits for anyone 18 and older to request care online for non-urgent symptoms. For details visit [email or web address]. ?  ?Also download the MyChart app! Go to the app store, search "MyChart", open the app, select [facility], and log in with your MyChart username and password. ? ?Due to Covid, a mask is required upon entering the hospital/clinic. If you do not have a mask, one will be given to you upon arrival. For doctor visits, patients may have 1 support person aged 18 or older with them. For treatment visits, patients cannot have anyone with them due to current Covid guidelines and our immunocompromised population.  ? ?

## 2021-02-25 NOTE — Progress Notes (Signed)
Due to low platelets, abraxane not given. Blood transfusions scheduled. 500cc IVF given for low BP and high HR. Pt tolerated gemzar and fluids well and discharged in stable condition.

## 2021-02-26 ENCOUNTER — Encounter: Payer: Self-pay | Admitting: Nurse Practitioner

## 2021-02-26 ENCOUNTER — Other Ambulatory Visit: Payer: Self-pay | Admitting: *Deleted

## 2021-02-26 ENCOUNTER — Inpatient Hospital Stay: Payer: Medicare Other

## 2021-02-26 ENCOUNTER — Other Ambulatory Visit: Payer: Self-pay

## 2021-02-26 ENCOUNTER — Telehealth: Payer: Self-pay | Admitting: *Deleted

## 2021-02-26 DIAGNOSIS — C259 Malignant neoplasm of pancreas, unspecified: Secondary | ICD-10-CM

## 2021-02-26 DIAGNOSIS — D696 Thrombocytopenia, unspecified: Secondary | ICD-10-CM | POA: Diagnosis not present

## 2021-02-26 DIAGNOSIS — Z5111 Encounter for antineoplastic chemotherapy: Secondary | ICD-10-CM | POA: Diagnosis not present

## 2021-02-26 DIAGNOSIS — Z79899 Other long term (current) drug therapy: Secondary | ICD-10-CM | POA: Diagnosis not present

## 2021-02-26 DIAGNOSIS — D649 Anemia, unspecified: Secondary | ICD-10-CM | POA: Diagnosis not present

## 2021-02-26 LAB — CANCER ANTIGEN 19-9: CA 19-9: 106895 U/mL — ABNORMAL HIGH (ref 0–35)

## 2021-02-26 MED ORDER — HEPARIN SOD (PORK) LOCK FLUSH 100 UNIT/ML IV SOLN
500.0000 [IU] | Freq: Every day | INTRAVENOUS | Status: AC | PRN
  Administered 2021-02-26: 500 [IU]

## 2021-02-26 MED ORDER — SODIUM CHLORIDE 0.9% IV SOLUTION
250.0000 mL | Freq: Once | INTRAVENOUS | Status: AC
  Administered 2021-02-26: 250 mL via INTRAVENOUS

## 2021-02-26 MED ORDER — ACETAMINOPHEN 325 MG PO TABS
650.0000 mg | ORAL_TABLET | Freq: Once | ORAL | Status: AC
  Administered 2021-02-26: 650 mg via ORAL
  Filled 2021-02-26: qty 2

## 2021-02-26 MED ORDER — DIPHENHYDRAMINE HCL 25 MG PO CAPS
25.0000 mg | ORAL_CAPSULE | Freq: Once | ORAL | Status: AC
  Administered 2021-02-26: 25 mg via ORAL
  Filled 2021-02-26: qty 1

## 2021-02-26 MED ORDER — SODIUM CHLORIDE 0.9% FLUSH
10.0000 mL | INTRAVENOUS | Status: AC | PRN
  Administered 2021-02-26: 10 mL

## 2021-02-26 NOTE — Patient Instructions (Signed)
Blood Transfusion, Adult, Care After This sheet gives you information about how to care for yourself after your procedure. Your doctor may also give you more specific instructions. If you have problems or questions, contact your doctor. What can I expect after the procedure? After the procedure, it is common to have: Bruising and soreness at the IV site. A headache. Follow these instructions at home: Insertion site care   Follow instructions from your doctor about how to take care of your insertion site. This is where an IV tube was put into your vein. Make sure you: Wash your hands with soap and water before and after you change your bandage (dressing). If you cannot use soap and water, use hand sanitizer. Change your bandage as told by your doctor. Check your insertion site every day for signs of infection. Check for: Redness, swelling, or pain. Bleeding from the site. Warmth. Pus or a bad smell. General instructions Take over-the-counter and prescription medicines only as told by your doctor. Rest as told by your doctor. Go back to your normal activities as told by your doctor. Keep all follow-up visits as told by your doctor. This is important. Contact a doctor if: You have itching or red, swollen areas of skin (hives). You feel worried or nervous (anxious). You feel weak after doing your normal activities. You have redness, swelling, warmth, or pain around the insertion site. You have blood coming from the insertion site, and the blood does not stop with pressure. You have pus or a bad smell coming from the insertion site. Get help right away if: You have signs of a serious reaction. This may be coming from an allergy or the body's defense system (immune system). Signs include: Trouble breathing or shortness of breath. Swelling of the face or feeling warm (flushed). Fever or chills. Head, chest, or back pain. Dark pee (urine) or blood in the pee. Widespread rash. Fast  heartbeat. Feeling dizzy or light-headed. You may receive your blood transfusion in an outpatient setting. If so, you will be told whom to contact to report any reactions. These symptoms may be an emergency. Do not wait to see if the symptoms will go away. Get medical help right away. Call your local emergency services (911 in the [place] Do not drive yourself to the hospital. Summary Bruising and soreness at the IV site are common. Check your insertion site every day for signs of infection. Rest as told by your doctor. Go back to your normal activities as told by your doctor. Get help right away if you have signs of a serious reaction. This information is not intended to replace advice given to you by your health care provider. Make sure you discuss any questions you have with your health care provider. Document Revised: 05/08/2020 Document Reviewed: 07/06/2018 Elsevier Patient Education  2022 Elsevier Inc.  

## 2021-02-26 NOTE — Progress Notes (Signed)
Error

## 2021-02-26 NOTE — Progress Notes (Signed)
Patient's son called expressing [facility] informed them that her Oxycontin would be $425 to fill. Jarrett Soho, RN called pharmacy to inquire if prior authorization was needed. I personally spoke with patient's insurance company, prior [place] completed and approved. I spoke to [place] who now states new cost is $172. Son is aware and is ok with cost. They will plan to go back and pick-up the prescription. We discussed changing prescription to [place] Contin when her next refill is due for more cost efficiency. We will further discuss at future appointment.   [employer]  Oxycontin Approved 15 mg every 12 hours Approved 01/25/2021-02/25/2022

## 2021-02-26 NOTE — Telephone Encounter (Signed)
This RN returned call to the pt's son, [name], per his VM wanting to give Dr [name] an update on the pt's status.  She received 1 unit of PRBC's this AM and is having some benefit already with energy.  [name] states the patient's abd is still distended - not worse then yesterday.  Pt had a " liquidy " stool this AM.  She denies any increased pain " is doing well today "  Pt is scheduled for her 2nd unit of blood on Saturday 02/28/2021.  This RN informed [name] information above would be forwarded to the MD.  Return call number for [name] if needed is [phone].

## 2021-02-27 ENCOUNTER — Encounter: Payer: Self-pay | Admitting: Hematology and Oncology

## 2021-02-27 DIAGNOSIS — C251 Malignant neoplasm of body of pancreas: Secondary | ICD-10-CM | POA: Diagnosis not present

## 2021-02-27 DIAGNOSIS — I10 Essential (primary) hypertension: Secondary | ICD-10-CM | POA: Diagnosis not present

## 2021-02-27 DIAGNOSIS — C787 Secondary malignant neoplasm of liver and intrahepatic bile duct: Secondary | ICD-10-CM | POA: Diagnosis not present

## 2021-02-27 DIAGNOSIS — I82412 Acute embolism and thrombosis of left femoral vein: Secondary | ICD-10-CM | POA: Diagnosis not present

## 2021-02-27 DIAGNOSIS — I2693 Single subsegmental pulmonary embolism without acute cor pulmonale: Secondary | ICD-10-CM | POA: Diagnosis not present

## 2021-02-27 DIAGNOSIS — C252 Malignant neoplasm of tail of pancreas: Secondary | ICD-10-CM | POA: Diagnosis not present

## 2021-02-28 ENCOUNTER — Other Ambulatory Visit: Payer: Self-pay

## 2021-02-28 ENCOUNTER — Inpatient Hospital Stay: Payer: Medicare Other

## 2021-02-28 DIAGNOSIS — Z79899 Other long term (current) drug therapy: Secondary | ICD-10-CM | POA: Diagnosis not present

## 2021-02-28 DIAGNOSIS — D649 Anemia, unspecified: Secondary | ICD-10-CM | POA: Diagnosis not present

## 2021-02-28 DIAGNOSIS — C259 Malignant neoplasm of pancreas, unspecified: Secondary | ICD-10-CM

## 2021-02-28 DIAGNOSIS — Z5111 Encounter for antineoplastic chemotherapy: Secondary | ICD-10-CM | POA: Diagnosis not present

## 2021-02-28 DIAGNOSIS — D696 Thrombocytopenia, unspecified: Secondary | ICD-10-CM | POA: Diagnosis not present

## 2021-02-28 LAB — PREPARE RBC (CROSSMATCH)

## 2021-02-28 MED ORDER — HEPARIN SOD (PORK) LOCK FLUSH 100 UNIT/ML IV SOLN
500.0000 [IU] | Freq: Once | INTRAVENOUS | Status: AC
  Administered 2021-02-28: 500 [IU] via INTRAVENOUS

## 2021-02-28 MED ORDER — DIPHENHYDRAMINE HCL 25 MG PO CAPS
25.0000 mg | ORAL_CAPSULE | Freq: Once | ORAL | Status: AC
  Administered 2021-02-28: 25 mg via ORAL

## 2021-02-28 MED ORDER — SODIUM CHLORIDE 0.9 % IV SOLN: Freq: Once | INTRAVENOUS | Status: AC

## 2021-02-28 MED ORDER — ACETAMINOPHEN 325 MG PO TABS
650.0000 mg | ORAL_TABLET | Freq: Once | ORAL | Status: AC
  Administered 2021-02-28: 650 mg via ORAL

## 2021-02-28 MED ORDER — SODIUM CHLORIDE 0.9% FLUSH
10.0000 mL | Freq: Once | INTRAVENOUS | Status: AC
  Administered 2021-02-28: 10 mL via INTRAVENOUS

## 2021-02-28 NOTE — Patient Instructions (Signed)
Blood Transfusion, Adult, Care After This sheet gives you information about how to care for yourself after your procedure. Your doctor may also give you more specific instructions. If you have problems or questions, contact your doctor. What can I expect after the procedure? After the procedure, it is common to have: Bruising and soreness at the IV site. A headache. Follow these instructions at home: Insertion site care   Follow instructions from your doctor about how to take care of your insertion site. This is where an IV tube was put into your vein. Make sure you: Wash your hands with soap and water before and after you change your bandage (dressing). If you cannot use soap and water, use hand sanitizer. Change your bandage as told by your doctor. Check your insertion site every day for signs of infection. Check for: Redness, swelling, or pain. Bleeding from the site. Warmth. Pus or a bad smell. General instructions Take over-the-counter and prescription medicines only as told by your doctor. Rest as told by your doctor. Go back to your normal activities as told by your doctor. Keep all follow-up visits as told by your doctor. This is important. Contact a doctor if: You have itching or red, swollen areas of skin (hives). You feel worried or nervous (anxious). You feel weak after doing your normal activities. You have redness, swelling, warmth, or pain around the insertion site. You have blood coming from the insertion site, and the blood does not stop with pressure. You have pus or a bad smell coming from the insertion site. Get help right away if: You have signs of a serious reaction. This may be coming from an allergy or the body's defense system (immune system). Signs include: Trouble breathing or shortness of breath. Swelling of the face or feeling warm (flushed). Fever or chills. Head, chest, or back pain. Dark pee (urine) or blood in the pee. Widespread rash. Fast  heartbeat. Feeling dizzy or light-headed. You may receive your blood transfusion in an outpatient setting. If so, you will be told whom to contact to report any reactions. These symptoms may be an emergency. Do not wait to see if the symptoms will go away. Get medical help right away. Call your local emergency services (911 in the [place] Do not drive yourself to the hospital. Summary Bruising and soreness at the IV site are common. Check your insertion site every day for signs of infection. Rest as told by your doctor. Go back to your normal activities as told by your doctor. Get help right away if you have signs of a serious reaction. This information is not intended to replace advice given to you by your health care provider. Make sure you discuss any questions you have with your health care provider. Document Revised: 05/08/2020 Document Reviewed: 07/06/2018 Elsevier Patient Education  2022 Elsevier Inc.  

## 2021-03-01 LAB — TYPE AND SCREEN
ABO/RH(D): A NEG
Antibody Screen: NEGATIVE
Unit division: 0
Unit division: 0

## 2021-03-01 LAB — BPAM RBC
Blood Product Expiration Date: 202302192359
Blood Product Expiration Date: 202302222359
ISSUE DATE / TIME: 202302020743
ISSUE DATE / TIME: 202302041159
Unit Type and Rh: 600
Unit Type and Rh: 600

## 2021-03-02 DIAGNOSIS — C252 Malignant neoplasm of tail of pancreas: Secondary | ICD-10-CM | POA: Diagnosis not present

## 2021-03-02 DIAGNOSIS — I2693 Single subsegmental pulmonary embolism without acute cor pulmonale: Secondary | ICD-10-CM | POA: Diagnosis not present

## 2021-03-02 DIAGNOSIS — I82412 Acute embolism and thrombosis of left femoral vein: Secondary | ICD-10-CM | POA: Diagnosis not present

## 2021-03-02 DIAGNOSIS — C787 Secondary malignant neoplasm of liver and intrahepatic bile duct: Secondary | ICD-10-CM | POA: Diagnosis not present

## 2021-03-02 DIAGNOSIS — C251 Malignant neoplasm of body of pancreas: Secondary | ICD-10-CM | POA: Diagnosis not present

## 2021-03-02 DIAGNOSIS — I10 Essential (primary) hypertension: Secondary | ICD-10-CM | POA: Diagnosis not present

## 2021-03-03 ENCOUNTER — Telehealth: Payer: Self-pay

## 2021-03-03 ENCOUNTER — Other Ambulatory Visit: Payer: Self-pay | Admitting: Nurse Practitioner

## 2021-03-03 DIAGNOSIS — Z515 Encounter for palliative care: Secondary | ICD-10-CM

## 2021-03-03 DIAGNOSIS — R634 Abnormal weight loss: Secondary | ICD-10-CM

## 2021-03-03 DIAGNOSIS — R630 Anorexia: Secondary | ICD-10-CM

## 2021-03-03 MED ORDER — DRONABINOL 2.5 MG PO CAPS: 2.5000 mg | ORAL_CAPSULE | Freq: Two times a day (BID) | ORAL | 0 refills | Status: DC

## 2021-03-03 NOTE — Telephone Encounter (Signed)
Patient's son called expressing concerns with Angie Rojas's decreased appetite and stamina. We previously discussed use of appetite stimulant. They would like to try and see if she shows any improvement in her appetite. Marinol sent to requested pharmacy with plans to follow-up on Thursday.

## 2021-03-03 NOTE — Telephone Encounter (Signed)
Angie Rojas, Mrs. Lagasse's son, called our office to inform us that Mrs. Cumberland has had difficulty with her appetite and was inquiring about any medication she could take to help with that. I notified Nikki, NP, who is placing an order for Marinol to increase her appetite. I called [name] and informed him of this medication. Understanding verbalized. All questions answered. Advised to call our office with any questions/concerns.

## 2021-03-04 ENCOUNTER — Other Ambulatory Visit: Payer: Self-pay | Admitting: Nurse Practitioner

## 2021-03-04 ENCOUNTER — Telehealth: Payer: Self-pay

## 2021-03-04 MED ORDER — DEXAMETHASONE 4 MG PO TABS: 4.0000 mg | ORAL_TABLET | Freq: Every day | ORAL | 0 refills | Status: DC

## 2021-03-04 NOTE — Telephone Encounter (Signed)
I called Magdalene Molly, Mrs. Dullea's son, to inform him that we are still working on the prior authorization for the Marinol, but that Lexine Baton, NP sent in Decadron to help with Mrs. Mcbryar's appetite and energy. Understanding verbalized. He mentioned Outpatient Palliative coming to assess Mrs. Bethards as well today. All questions answered. Advised to call our office with any questions/concerns.

## 2021-03-05 ENCOUNTER — Inpatient Hospital Stay (HOSPITAL_BASED_OUTPATIENT_CLINIC_OR_DEPARTMENT_OTHER): Payer: Medicare Other | Admitting: Hematology and Oncology

## 2021-03-05 ENCOUNTER — Inpatient Hospital Stay: Payer: Medicare Other

## 2021-03-05 ENCOUNTER — Telehealth: Payer: Self-pay

## 2021-03-05 ENCOUNTER — Encounter: Payer: Self-pay | Admitting: Nurse Practitioner

## 2021-03-05 ENCOUNTER — Other Ambulatory Visit: Payer: Self-pay

## 2021-03-05 ENCOUNTER — Encounter: Payer: Self-pay | Admitting: Hematology and Oncology

## 2021-03-05 ENCOUNTER — Other Ambulatory Visit: Payer: Self-pay | Admitting: Hematology and Oncology

## 2021-03-05 ENCOUNTER — Inpatient Hospital Stay (HOSPITAL_BASED_OUTPATIENT_CLINIC_OR_DEPARTMENT_OTHER): Payer: Medicare Other | Admitting: Nurse Practitioner

## 2021-03-05 ENCOUNTER — Inpatient Hospital Stay: Payer: Medicare Other | Admitting: Dietician

## 2021-03-05 ENCOUNTER — Other Ambulatory Visit: Payer: Medicare Other

## 2021-03-05 DIAGNOSIS — R530 Neoplastic (malignant) related fatigue: Secondary | ICD-10-CM

## 2021-03-05 DIAGNOSIS — C259 Malignant neoplasm of pancreas, unspecified: Secondary | ICD-10-CM

## 2021-03-05 DIAGNOSIS — C787 Secondary malignant neoplasm of liver and intrahepatic bile duct: Secondary | ICD-10-CM

## 2021-03-05 DIAGNOSIS — Z7189 Other specified counseling: Secondary | ICD-10-CM | POA: Diagnosis not present

## 2021-03-05 DIAGNOSIS — G893 Neoplasm related pain (acute) (chronic): Secondary | ICD-10-CM | POA: Diagnosis not present

## 2021-03-05 DIAGNOSIS — D696 Thrombocytopenia, unspecified: Secondary | ICD-10-CM | POA: Diagnosis not present

## 2021-03-05 DIAGNOSIS — D649 Anemia, unspecified: Secondary | ICD-10-CM | POA: Diagnosis not present

## 2021-03-05 DIAGNOSIS — Z515 Encounter for palliative care: Secondary | ICD-10-CM

## 2021-03-05 DIAGNOSIS — R634 Abnormal weight loss: Secondary | ICD-10-CM

## 2021-03-05 DIAGNOSIS — R531 Weakness: Secondary | ICD-10-CM

## 2021-03-05 DIAGNOSIS — Z5111 Encounter for antineoplastic chemotherapy: Secondary | ICD-10-CM | POA: Diagnosis not present

## 2021-03-05 DIAGNOSIS — R630 Anorexia: Secondary | ICD-10-CM

## 2021-03-05 DIAGNOSIS — Z79899 Other long term (current) drug therapy: Secondary | ICD-10-CM | POA: Diagnosis not present

## 2021-03-05 LAB — PREPARE RBC (CROSSMATCH)

## 2021-03-05 LAB — CBC WITH DIFFERENTIAL (CANCER CENTER ONLY)
Abs Immature Granulocytes: 2.57 10*3/uL — ABNORMAL HIGH (ref 0.00–0.07)
Basophils Absolute: 0 10*3/uL (ref 0.0–0.1)
Basophils Relative: 0 %
Eosinophils Absolute: 0 10*3/uL (ref 0.0–0.5)
Eosinophils Relative: 0 %
HCT: 16.6 % — ABNORMAL LOW (ref 36.0–46.0)
Hemoglobin: 5.2 g/dL — CL (ref 12.0–15.0)
Immature Granulocytes: 10 %
Lymphocytes Relative: 15 %
Lymphs Abs: 3.9 10*3/uL (ref 0.7–4.0)
MCH: 29.4 pg (ref 26.0–34.0)
MCHC: 31.3 g/dL (ref 30.0–36.0)
MCV: 93.8 fL (ref 80.0–100.0)
Monocytes Absolute: 2.2 10*3/uL — ABNORMAL HIGH (ref 0.1–1.0)
Monocytes Relative: 8 %
Neutro Abs: 17.7 10*3/uL — ABNORMAL HIGH (ref 1.7–7.7)
Neutrophils Relative %: 67 %
Platelet Count: 25 10*3/uL — ABNORMAL LOW (ref 150–400)
RBC: 1.77 MIL/uL — ABNORMAL LOW (ref 3.87–5.11)
RDW: 20.9 % — ABNORMAL HIGH (ref 11.5–15.5)
Smear Review: NORMAL
WBC Count: 26.4 10*3/uL — ABNORMAL HIGH (ref 4.0–10.5)
nRBC: 3.6 % — ABNORMAL HIGH (ref 0.0–0.2)

## 2021-03-05 LAB — CMP (CANCER CENTER ONLY)
ALT: 43 U/L (ref 0–44)
AST: 72 U/L — ABNORMAL HIGH (ref 15–41)
Albumin: 2.5 g/dL — ABNORMAL LOW (ref 3.5–5.0)
Alkaline Phosphatase: 433 U/L — ABNORMAL HIGH (ref 38–126)
Anion gap: 7 (ref 5–15)
BUN: 27 mg/dL — ABNORMAL HIGH (ref 8–23)
CO2: 26 mmol/L (ref 22–32)
Calcium: 8.3 mg/dL — ABNORMAL LOW (ref 8.9–10.3)
Chloride: 96 mmol/L — ABNORMAL LOW (ref 98–111)
Creatinine: 0.75 mg/dL (ref 0.44–1.00)
GFR, Estimated: >60 mL/min (ref >60)
Glucose, Bld: 156 mg/dL — ABNORMAL HIGH (ref 70–99)
Potassium: 4 mmol/L (ref 3.5–5.1)
Sodium: 129 mmol/L — ABNORMAL LOW (ref 135–145)
Total Bilirubin: 2 mg/dL — ABNORMAL HIGH (ref 0.3–1.2)
Total Protein: 5.1 g/dL — ABNORMAL LOW (ref 6.5–8.1)

## 2021-03-05 LAB — SAMPLE TO BLOOD BANK

## 2021-03-05 MED ORDER — SODIUM CHLORIDE 0.9% IV SOLUTION
250.0000 mL | Freq: Once | INTRAVENOUS | Status: AC
  Administered 2021-03-05: 250 mL via INTRAVENOUS

## 2021-03-05 MED ORDER — DIPHENHYDRAMINE HCL 25 MG PO CAPS
25.0000 mg | ORAL_CAPSULE | Freq: Once | ORAL | Status: AC
  Administered 2021-03-05: 25 mg via ORAL
  Filled 2021-03-05: qty 1

## 2021-03-05 MED ORDER — ACETAMINOPHEN 325 MG PO TABS
650.0000 mg | ORAL_TABLET | Freq: Once | ORAL | Status: AC
  Administered 2021-03-05: 650 mg via ORAL
  Filled 2021-03-05: qty 2

## 2021-03-05 NOTE — Telephone Encounter (Signed)
CRITICAL VALUE STICKER  CRITICAL VALUE:  Hgb  5.2  RECEIVER (on-site recipient of call): Rondel Baton, LPN  [place] NOTIFIED: 03/05/21  09:40  MESSENGER (representative from lab):  Loren  MD NOTIFIED:  [name]  TIME OF NOTIFICATION: 09:44  RESPONSE:

## 2021-03-05 NOTE — Progress Notes (Signed)
Per Dr. [name] - patient will not be receiving treatment today. Patient to receive 2 units of RBC.  Orders placed. Infusion RN and charge RN aware.

## 2021-03-05 NOTE — Progress Notes (Signed)
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 Patient diagnosed on 02/11/21. She is receiving chemotherapy with Abraxane/Gemcitabine q28d s/p first cycle on 2/1. Treatment discontinued today, and patient agreeable to hospice referral. She is to receive 2 units PRBCs. Patient is under the care of Dr. [name].   Past medical history includes bilateral PE, left femoral DVT, sevPCM  Met with patient during infusion. She reports feeling "out of it" but agreeable to visit. Patient reports not doing well, states "I'm dying." Patient reports having no appetite, she likes sierra mist. Patient unable to provide dietary recall today. Patient asking for recipes for her vitamix. She bought this ~1.5 years ago after the passing of her husband and has never used it.   Nutrition Focused Physical Exam:  Severe fat depletion - orbital, buccal  Severe muscle depletion - temple, clavicles, dorsal hand   Medications: Decadron, Marinol, Oxycontin, Colace, Compazine, Zofran, Eliquis, Xanax, Creon, Roxicodone, Miralax, Senokot, Zoloft   Labs: Hgb 5.2, Na 129, Glucose 156, BUN 27, Albumin 2.5, AST 72, Alkaline Phos 433   Anthropometrics: Noted patient too weak to stand for new wt today. Last weight 132 lb on 2/01 decreased 7.7% (11 lbs) in 7 days. Patient weighed 143 lb 1.3 oz on 1/25. 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

## 2021-03-05 NOTE — Assessment & Plan Note (Signed)
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  When she last went to her chemotherapy appointment, she was found to have severe anemia and progressive thrombocytopenia concerning for liver failure and bone marrow suppression.  She denied any acute bleeding.  After much discussion, we have agreed to give her 75% dose of Gemzar alone, 2 units of packed red blood cells and avoid Abraxane.  She is here for day 8 of chemotherapy.  She continues to deteriorate, clinically now ECOG 3 at least.  Her blood counts show progressive anemia and thrombocytopenia with hemoglobin of 5.2 and platelet count of 25,000.  I have discussed with family that she most likely has bone marrow suppression from liver failure as well as from chemotherapy and she unfortunately is not able to receive any more treatment at this time. I have once again discussed goals of care since I am very worried about her life expectancy and discussed to consider hospice. Patient is agreeable that she is very weak, cannot even get out of the chair to consider more treatment.  She is agreeable with hospice at this time.  We will proceed with 2 units of packed red blood cells today, she was asked to discontinue Eliquis, and she can return to clinic as needed.

## 2021-03-05 NOTE — Progress Notes (Signed)
[facility]  [phone]) [phone] [phone]) [phone]   Name: Angie Rojas Date: 03/05/2021 MRN: [id number]  DOB: 1945/07/06  Patient Care Team: Lajean Manes, MD as PCP - General (Internal Medicine)    REASON FOR CONSULTATION: Angie Rojas is a 76 y.o. 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 Palliative ask to see for symptom management and goals of care.    SOCIAL HISTORY:     reports that she has never smoked. She has never used smokeless tobacco. She reports that she does not drink alcohol.  ADVANCE DIRECTIVES:  Patient has an active advance directive on file. Her sons Iona Beard and Juanda Crumble are her designated medical decision makers. She would not want life-sustaining measures per documents in the case of terminal illness.   CODE STATUS:   PAST MEDICAL HISTORY: Past Medical History:  Diagnosis Date   DVT (deep venous thrombosis) (HCC)    LLE DVT Jan 2023   HEMATOLOGY/ONCOLOGY HISTORY:   ALLERGIES:  has No Known Allergies.  MEDICATIONS:  Current Outpatient Medications  Medication Sig Dispense Refill   ALPRAZolam (XANAX) 0.5 MG tablet Take 1 tablet (0.5 mg total) by mouth 3 (three) times daily as needed for anxiety. 30 tablet 0   apixaban (ELIQUIS) 5 MG TABS tablet Take 1 tablet (5 mg total) by mouth 2 (two) times daily. 60 tablet 3   bisacodyl (DULCOLAX) 10 MG suppository Place 1 suppository (10 mg total) rectally as needed for moderate constipation. 30 suppository 0   Carboxymethylcellulose Sodium (THERATEARS OP) Place 1 drop into both eyes daily as needed (dry eyes).     dexamethasone (DECADRON) 4 MG tablet Take 1 tablet (4 mg total) by mouth daily. 30 tablet 0   docusate sodium (COLACE) 100 MG capsule Take 1 capsule (100 mg total) by mouth 2 (two) times daily. 60  capsule 2   dronabinol (MARINOL) 2.5 MG capsule Take 1 capsule (2.5 mg total) by mouth 2 (two) times daily before lunch and supper. 60 capsule 0   lidocaine-prilocaine (EMLA) cream Apply to affected area once 30 g 3   lipase/protease/amylase (CREON) 12000-38000 units CPEP capsule Take 1 capsule (12,000 Units total) by mouth 3 (three) times daily before meals. 270 capsule 0   Multiple Vitamin (MULTIVITAMIN WITH MINERALS) TABS tablet Take 1 tablet by mouth daily.     ondansetron (ZOFRAN) 4 MG tablet Take 1 tablet (4 mg total) by mouth every 6 (six) hours as needed for nausea. 20 tablet 0   ondansetron (ZOFRAN) 8 MG tablet Take 1 tablet (8 mg total) by mouth 2 (two) times daily as needed (Nausea or vomiting). 30 tablet 1   OVER THE COUNTER MEDICATION Take 7.5 mLs by mouth 2 (two) times daily. CBD oil liquid     oxyCODONE (OXY IR/ROXICODONE) 5 MG immediate release tablet Take 1-2 tablets (5-10 mg total) by mouth every 4 (four) hours as needed (mild pain). 30 tablet 0   oxyCODONE (OXYCONTIN) 15 mg 12 hr tablet Take 1 tablet (15 mg total) by mouth every 12 (twelve) hours. 60 tablet 0   polyethylene glycol (MIRALAX / GLYCOLAX) 17 g packet Take 17 g by mouth 2 (two) times daily. (Patient taking differently: Take 17 g by mouth 2 (two) times daily as needed for moderate constipation.) 14 each 0   prochlorperazine (COMPAZINE) 10 MG tablet Take 1 tablet (10 mg total) by  mouth every 6 (six) hours as needed (Nausea or vomiting). 30 tablet 1   senna (SENOKOT) 8.6 MG TABS tablet Take 1 tablet (8.6 mg total) by mouth daily. 120 tablet 0   sertraline (ZOLOFT) 25 MG tablet Take 1 tablet (25 mg total) by mouth daily. 30 tablet 1   No current facility-administered medications for this visit.   Facility-Administered Medications Ordered in Other Visits  Medication Dose Route Frequency Provider Last Rate Last Admin   0.9 %  sodium chloride infusion (Manually program via Guardrails IV Fluids)  250 mL Intravenous Once  Iruku, Praveena, MD        VITAL SIGNS: There were no vitals taken for this visit. There were no vitals filed for this visit.   Estimated body mass index is 25.78 kg/m as calculated from the following:   Height as of 02/28/21: 5' (1.524 m).   Weight as of 02/25/21: 132 lb (59.9 kg).   PERFORMANCE STATUS (ECOG) : 2 - Symptomatic, <50% confined to bed   Physical Exam General: NAD, weak, ill-appearing Cardiovascular: Tachycardic, hypotensive Pulmonary: clear ant fields Abdomen: firm, tender, distended, + bowel sounds Neurological: Weakness but otherwise nonfocal  IMPRESSION:  [name] is frail and weak appearing. She is receiving blood transfusion today. Continues to have fatigue and poor appetite. Her son and daughter-in-law is with her today. I spoke with her son [name] via phone.   Pain  [name] and her sons share that she has ongoing abdominal pain. Pain is well controlled on current regimen.   Fatigue Weakness is worsening. She requires assistance with ADLs. Is sleeping more often and does not have much interest in doing anything due her fatigue. She appears much weaker today compared to previous visit.   Anorexia/weight loss  Appetite remains minimal. Family reports attempts to encourage her to eat by making foods she once enjoyed available. [name] reports she has no appetite and when she eats she immediately feels full. Weight continues to decline   Anxiety  She is anxious in regards to her extensive cancer diagnosis and the fear of the unknown. Seems withdrawn when attempts to discuss future outlook and poor prognosis.    5. Goals of Care  I was able to speak with her sons regarding recent discussions with Dr. [name] regarding recommendations for outpatient hospice support. [name] is appropriately emotional expressing hopes for additional time with his mother but knows that she continues to decline. They do not wish for her to suffer. We discussed at length expectations and  hospice's goals and philosophy of care.   Family verbalized understanding and appreciation. Aware we are available for additional support as needed.   I discussed the importance of continued conversation with family and their medical providers regarding overall plan of care and treatment options, ensuring decisions are within the context of the patients values and GOCs.  PLAN: Oxycontin 15 mg every 12 hours for pain Oxy IR 5-10 mg every 4 hours as needed for breakthrough pain.  Continue xanax as needed for anxiety.  Senna-S and Miralax daily for bowel regimen. Dulcolax suppository as needed.  Patient's prognosis is poor and continues to decline. Recommendations per Dr. [name] for outpatient hospice support. Education provided. Plans for [facility] to be involved.  Family knows medical team is available as needed.     Patient expressed understanding and was in agreement with this plan. She also understands that She can call the clinic at any time with any questions, concerns, or complaints.  Time Total: 45 min  Visit consisted of counseling and education dealing with the complex and emotionally intense issues of symptom management and palliative care in the setting of serious and potentially life-threatening illness.Greater than 50%  of this time was spent counseling and coordinating care related to the above assessment and plan.  Signed by: Alda Lea, AGPCNP-BC Palliative Medicine Team

## 2021-03-05 NOTE — Patient Instructions (Signed)
Blood Transfusion, Adult, Care After This sheet gives you information about how to care for yourself after your procedure. Your doctor may also give you more specific instructions. If you have problems or questions, contact your doctor. What can I expect after the procedure? After the procedure, it is common to have: Bruising and soreness at the IV site. A headache. Follow these instructions at home: Insertion site care   Follow instructions from your doctor about how to take care of your insertion site. This is where an IV tube was put into your vein. Make sure you: Wash your hands with soap and water before and after you change your bandage (dressing). If you cannot use soap and water, use hand sanitizer. Change your bandage as told by your doctor. Check your insertion site every day for signs of infection. Check for: Redness, swelling, or pain. Bleeding from the site. Warmth. Pus or a bad smell. General instructions Take over-the-counter and prescription medicines only as told by your doctor. Rest as told by your doctor. Go back to your normal activities as told by your doctor. Keep all follow-up visits as told by your doctor. This is important. Contact a doctor if: You have itching or red, swollen areas of skin (hives). You feel worried or nervous (anxious). You feel weak after doing your normal activities. You have redness, swelling, warmth, or pain around the insertion site. You have blood coming from the insertion site, and the blood does not stop with pressure. You have pus or a bad smell coming from the insertion site. Get help right away if: You have signs of a serious reaction. This may be coming from an allergy or the body's defense system (immune system). Signs include: Trouble breathing or shortness of breath. Swelling of the face or feeling warm (flushed). Fever or chills. Head, chest, or back pain. Dark pee (urine) or blood in the pee. Widespread rash. Fast  heartbeat. Feeling dizzy or light-headed. You may receive your blood transfusion in an outpatient setting. If so, you will be told whom to contact to report any reactions. These symptoms may be an emergency. Do not wait to see if the symptoms will go away. Get medical help right away. Call your local emergency services (911 in the [place] Do not drive yourself to the hospital. Summary Bruising and soreness at the IV site are common. Check your insertion site every day for signs of infection. Rest as told by your doctor. Go back to your normal activities as told by your doctor. Get help right away if you have signs of a serious reaction. This information is not intended to replace advice given to you by your health care provider. Make sure you discuss any questions you have with your health care provider. Document Revised: 05/08/2020 Document Reviewed: 07/06/2018 Elsevier Patient Education  2022 Elsevier Inc.  

## 2021-03-05 NOTE — Progress Notes (Signed)
Hospice referral placed per discussion with patient.  Angie Rojas

## 2021-03-05 NOTE — Progress Notes (Signed)
Responded to a call to provide support to [place]. Provided listening and facilitated sharing of emotions and fears.  Provided prayer, at [facility] request, and brought Shree a prayer shawl for comfort.    [employer], Prien Pager, [phone] 5:02 PM

## 2021-03-05 NOTE — Progress Notes (Signed)
[facility] CONSULT NOTE  Patient Care Team: Lajean Manes, MD as PCP - General (Internal Medicine)  CHIEF COMPLAINTS/PURPOSE OF CONSULTATION:  Metastatic pancreatic cancer  ASSESSMENT & PLAN:  Adenocarcinoma of pancreas, stage 4 ([place]) This is a very pleasant 76 year old female patient with no significant past medical history, school bus driver by occupation who presented with acute PE, diagnosed with metastatic pancreatic adenocarcinoma who is here for follow-up after cycle 1 day 1 of Gemzar.  When she last went to her chemotherapy appointment, she was found to have severe anemia and progressive thrombocytopenia concerning for liver failure and bone marrow suppression.  She denied any acute bleeding.  After much discussion, we have agreed to give her 75% dose of Gemzar alone, 2 units of packed red blood cells and avoid Abraxane.  She is here for day 8 of chemotherapy.  She continues to deteriorate, clinically now ECOG 3 at least.  Her blood counts show progressive anemia and thrombocytopenia with hemoglobin of 5.2 and platelet count of 25,000.  I have discussed with family that she most likely has bone marrow suppression from liver failure as well as from chemotherapy and she unfortunately is not able to receive any more treatment at this time. I have once again discussed goals of care since I am very worried about her life expectancy and discussed to consider hospice. Patient is agreeable that she is very weak, cannot even get out of the chair to consider more treatment.  She is agreeable with hospice at this time.  We will proceed with 2 units of packed red blood cells today, she was asked to discontinue Eliquis, and she can return to clinic as needed.   Orders Placed This Encounter  Procedures   Prepare RBC (crossmatch)    Standing Status:   Standing    Number of Occurrences:   1    Order Specific Question:   # of Units    Answer:   2 units    Order Specific Question:    Transfusion Indications    Answer:   Symptomatic Anemia    Order Specific Question:   Number of Units to Keep Ahead    Answer:   NO units ahead    Order Specific Question:   If emergent release call blood bank    Answer:   Not emergent release    HISTORY OF PRESENTING ILLNESS:  Angie Rojas 76 y.o. female is here because of metastatic pancreatic adenocarcinoma  Angie Rojas is a 76 year old female with a past medical history significant for a left lower extremity DVT diagnosed in January 2023 and recently started on Eliquis.  She presented to the emergency department with abdominal pain.  She has been having abdominal pain for 3 to 4 weeks.  Pain has been intermittent but becoming more constant and radiates to her back.  She rated pain 10/10.  She was also experiencing shortness of breath and chest pain.  Initial lab work showed a WBC of 19.4, hemoglobin 9.2, platelet count 542,000, albumin 2.7, AST 61, ALT 59, alk phos 413.  CTA chest was performed which showed acute PE in few segmental and subsegmental branches in the right lower lobe with small thrombus burden.  CT abdomen/pelvis showed diffuse hepatic metastatic disease, 2.6 cm lesion in the pancreatic tail which could be the primary neoplasm however there is a second lesion in the pancreatic body/tail junction region which could reflect metastatic disease also, celiac axis lymphadenopathy and mesenteric implants, borderline retroperitoneal lymph nodes,  3.7 cm mass in the uterus which could be a partially degenerated fibroid. CEA was mildly elevated at 70.0 and CA19.9 was elevated at 84,088.  She underwent ultrasound-guided liver biopsy on 02/12/2021.   Final pathology showed a poorly differentiated adenocarcinoma, strongly positive for cytokeratin 7, cytokeratin 20 and CDX2 focally positive but negative for GATA3 and PAX8.   Differential diagnosis include pancreaticobiliary less likely upper GI.  She is here for hospital follow-up with her son  Juanda Crumble and his wife [name].  She is in a wheelchair today.   She complains of pain which is better controlled compared to the hospital however not completely gone.  She is taking OxyContin 15 mg twice a day and oxycodone as needed.  [name] who is an OR nurse at [employer] mentions that she took 1 dose of OxyContin this morning and has not taken any oxycodone during the day.  Patient appears quite comfortable during my conversation compared to my initial consults during the hospital when she was resting in pain.  Today she complains of bloating, pain in the abdomen.  She most of the times does not look at me while conversing and appears to be focused on the pain.  Son  [name] was on the phone during our conversation.  During our previous visits, I have clearly discussed the treatment options, prognosis and role of hospice. Patient wanted to try chemotherapy, she was clearly told the intent of treatment is palliative When she went for C1D1 of gemabraxane given severe anemia and thrombocytopenia, we decided to only proceed with gemzar alone at 75% dose. Again, I expressed several times that I was concerned that she may not be able to tolerate chemotherapy, but this was something she wanted to try since she was very healthy prior to all of this.  Today she is in a wheelchair , son Eliott Nine and his wife [name] were here. Patient says she is very weak, she cant even get out of the chair by herself, needs help with all ADL's Her pain is reasonably well controlled. She is not eating, continuing to lose weight.  MEDICAL HISTORY:  Past Medical History:  Diagnosis Date   DVT (deep venous thrombosis) (HCC)    LLE DVT Jan 2023    SURGICAL HISTORY: Past Surgical History:  Procedure Laterality Date   IR IMAGING GUIDED PORT INSERTION  02/24/2021    SOCIAL HISTORY: Social History   Socioeconomic History   Marital status: Married    Spouse name: Not on file   Number of children: Not on file   Years of education: Not  on file   Highest education level: Not on file  Occupational History   Not on file  Tobacco Use   Smoking status: Never   Smokeless tobacco: Never  Substance and Sexual Activity   Alcohol use: Never   Drug use: Not on file   Sexual activity: Not on file  Other Topics Concern   Not on file  Social History Narrative   Not on file   Social Determinants of Health   Financial Resource Strain: Not on file  Food Insecurity: Not on file  Transportation Needs: Not on file  Physical Activity: Not on file  Stress: Not on file  Social Connections: Not on file  Intimate Partner Violence: Not on file    FAMILY HISTORY: No family history on file.  ALLERGIES:  has No Known Allergies.  MEDICATIONS:  Current Outpatient Medications  Medication Sig Dispense Refill   ALPRAZolam (XANAX) 0.5 MG tablet  Take 1 tablet (0.5 mg total) by mouth 3 (three) times daily as needed for anxiety. 30 tablet 0   apixaban (ELIQUIS) 5 MG TABS tablet Take 1 tablet (5 mg total) by mouth 2 (two) times daily. 60 tablet 3   bisacodyl (DULCOLAX) 10 MG suppository Place 1 suppository (10 mg total) rectally as needed for moderate constipation. 30 suppository 0   Carboxymethylcellulose Sodium (THERATEARS OP) Place 1 drop into both eyes daily as needed (dry eyes).     dexamethasone (DECADRON) 4 MG tablet Take 1 tablet (4 mg total) by mouth daily. 30 tablet 0   docusate sodium (COLACE) 100 MG capsule Take 1 capsule (100 mg total) by mouth 2 (two) times daily. 60 capsule 2   dronabinol (MARINOL) 2.5 MG capsule Take 1 capsule (2.5 mg total) by mouth 2 (two) times daily before lunch and supper. 60 capsule 0   lidocaine-prilocaine (EMLA) cream Apply to affected area once 30 g 3   lipase/protease/amylase (CREON) 12000-38000 units CPEP capsule Take 1 capsule (12,000 Units total) by mouth 3 (three) times daily before meals. 270 capsule 0   Multiple Vitamin (MULTIVITAMIN WITH MINERALS) TABS tablet Take 1 tablet by mouth daily.      ondansetron (ZOFRAN) 4 MG tablet Take 1 tablet (4 mg total) by mouth every 6 (six) hours as needed for nausea. 20 tablet 0   ondansetron (ZOFRAN) 8 MG tablet Take 1 tablet (8 mg total) by mouth 2 (two) times daily as needed (Nausea or vomiting). 30 tablet 1   OVER THE COUNTER MEDICATION Take 7.5 mLs by mouth 2 (two) times daily. CBD oil liquid     oxyCODONE (OXY IR/ROXICODONE) 5 MG immediate release tablet Take 1-2 tablets (5-10 mg total) by mouth every 4 (four) hours as needed (mild pain). 30 tablet 0   oxyCODONE (OXYCONTIN) 15 mg 12 hr tablet Take 1 tablet (15 mg total) by mouth every 12 (twelve) hours. 60 tablet 0   polyethylene glycol (MIRALAX / GLYCOLAX) 17 g packet Take 17 g by mouth 2 (two) times daily. (Patient taking differently: Take 17 g by mouth 2 (two) times daily as needed for moderate constipation.) 14 each 0   prochlorperazine (COMPAZINE) 10 MG tablet Take 1 tablet (10 mg total) by mouth every 6 (six) hours as needed (Nausea or vomiting). 30 tablet 1   senna (SENOKOT) 8.6 MG TABS tablet Take 1 tablet (8.6 mg total) by mouth daily. 120 tablet 0   sertraline (ZOLOFT) 25 MG tablet Take 1 tablet (25 mg total) by mouth daily. 30 tablet 1   No current facility-administered medications for this visit.     PHYSICAL EXAMINATION: ECOG PERFORMANCE STATUS: 2 - Symptomatic, <50% confined to bed  Vitals:   03/05/21 0925 03/05/21 0944  BP: (!) 135/103 100/61  Pulse: (!) 119 (!) 116  Resp: 16 18  Temp: 97.9 F (36.6 C)   SpO2: 100% 96%    Filed Weights     GENERAL:alert, nonicteric, in a wheelchair  SKIN: No icterus  Abdomen: Appears bloated, bowel sounds present but hypoactive Psych: Tearful, avoids eye contact appears to be a very afraid of unknown.  LABORATORY DATA:  I have reviewed the data as listed Lab Results  Component Value Date   WBC 26.4 (H) 03/05/2021   HGB 5.2 (LL) 03/05/2021   HCT 16.6 (L) 03/05/2021   MCV 93.8 03/05/2021   PLT 25 (L) 03/05/2021      Chemistry      Component Value Date/Time   NA  129 (L) 03/05/2021 0905   K 4.0 03/05/2021 0905   CL 96 (L) 03/05/2021 0905   CO2 26 03/05/2021 0905   BUN 27 (H) 03/05/2021 0905   CREATININE 0.75 03/05/2021 0905      Component Value Date/Time   CALCIUM 8.3 (L) 03/05/2021 0905   ALKPHOS 433 (H) 03/05/2021 0905   AST 72 (H) 03/05/2021 0905   ALT 43 03/05/2021 0905   BILITOT 2.0 (H) 03/05/2021 0905       RADIOGRAPHIC STUDIES: I have personally reviewed the radiological images as listed and agreed with the findings in the report. DG Chest 2 View  Result Date: 02/11/2021 CLINICAL DATA:  Shortness of breath and vomiting.  DVT. EXAM: CHEST - 2 VIEW COMPARISON:  None. FINDINGS: Trachea is midline. Heart size normal. Lungs are clear. No pleural fluid. IMPRESSION: No acute findings. Electronically Signed   By: Lorin Picket M.D.   On: 02/11/2021 13:50   CT Head Wo Contrast  Result Date: 02/11/2021 CLINICAL DATA:  Mental status change.  Rule out metastatic disease EXAM: CT HEAD WITHOUT CONTRAST TECHNIQUE: Contiguous axial images were obtained from the base of the skull through the vertex without intravenous contrast. RADIATION DOSE REDUCTION: This exam was performed according to the departmental dose-optimization program which includes automated exposure control, adjustment of the mA and/or kV according to patient size and/or use of iterative reconstruction technique. COMPARISON:  MRI head 07/03/2013 FINDINGS: Brain: No evidence of acute infarction, hemorrhage, hydrocephalus, extra-axial collection or mass lesion/mass effect. Vascular: Negative for hyperdense vessel Skull: Negative Sinuses/Orbits: Paranasal sinuses clear.  Negative orbit Other: None IMPRESSION: Negative CT head Electronically Signed   By: Franchot Gallo M.D.   On: 02/11/2021 18:07   CT Angio Chest PE W and/or Wo Contrast  Result Date: 02/11/2021 CLINICAL DATA:  Shortness of breath, history of DVT in the left lower extremity  EXAM: CT ANGIOGRAPHY CHEST WITH CONTRAST TECHNIQUE: Multidetector CT imaging of the chest was performed using the standard protocol during bolus administration of intravenous contrast. Multiplanar CT image reconstructions and MIPs were obtained to evaluate the vascular anatomy. RADIATION DOSE REDUCTION: This exam was performed according to the departmental dose-optimization program which includes automated exposure control, adjustment of the mA and/or kV according to patient size and/or use of iterative reconstruction technique. CONTRAST:  165mL OMNIPAQUE IOHEXOL 350 MG/ML SOLN COMPARISON:  None. FINDINGS: Cardiovascular: Contrast density in the pulmonary artery branches is less than optimal. There is more contrast enhancement in the systemic vessels suggesting less than optimal timing of the imaging sequence. As far as seen, there are no filling defects in central pulmonary artery branches. There are few intraluminal filling defects in the segmental and subsegmental branches in the right lower lung fields with small thrombus burden. There is no ectasia of main pulmonary artery. Right ventricular cavity is more prominent than usual suggesting possible right ventricular strain. Midportion of right ventricular cavity measures 3.7 cm in diameter. Mediastinum/Nodes: No significant lymphadenopathy seen. Lungs/Pleura: There is no focal pulmonary consolidation. There is no pleural effusion or pneumothorax. Small linear densities in the lower lung fields suggest scarring or subsegmental atelectasis. Upper Abdomen: There are numerous space-occupying lesions of varying sizes in the visualized upper portions of liver measuring up to 5.3 cm in diameter. There is fatty infiltration in the liver. Musculoskeletal: Unremarkable. Review of the MIP images confirms the above findings. IMPRESSION: Acute pulmonary embolism in few segmental and subsegmental branches in the right lower lobe. Small thrombus burden. There is dilation of  right  ventricular cavity suggesting possible right ventricular strain. There is no evidence of thoracic aortic dissection. There is no focal pulmonary consolidation. There are numerous space-occupying lesions of varying sizes and varying attenuation in the liver suggesting extensive hepatic metastatic disease. Fatty infiltration is seen in the liver. Electronically Signed   By: Elmer Picker M.D.   On: 02/11/2021 16:02   CT ABDOMEN PELVIS W CONTRAST  Result Date: 02/11/2021 CLINICAL DATA:  Abdominal pain. EXAM: CT ABDOMEN AND PELVIS WITH CONTRAST TECHNIQUE: Multidetector CT imaging of the abdomen and pelvis was performed using the standard protocol following bolus administration of intravenous contrast. RADIATION DOSE REDUCTION: This exam was performed according to the departmental dose-optimization program which includes automated exposure control, adjustment of the mA and/or kV according to patient size and/or use of iterative reconstruction technique. CONTRAST:  175mL OMNIPAQUE IOHEXOL 350 MG/ML SOLN COMPARISON:  None. FINDINGS: Lower chest: Free motion artifact but no pulmonary lesions or pleural effusions. 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 Largest lesion in the left hepatic lobe on image number 18/3 measures 4.4 cm. Index lesion in the right hepatic lobe on image 35/3 measures 4.1 cm. The gallbladder is grossly normal.  No common bile duct dilatation. Pancreas: There is a 2.6 cm lesion in the pancreatic tail which could be the primary neoplasm. There is also a small lesion in the body tail junction region on image 32/3 which measures 10 mm. Spleen: Normal size.  No splenic lesions. Adrenals/Urinary Tract: No adrenal gland lesions. Small renal cysts. No worrisome renal lesions. The bladder is unremarkable. Stomach/Bowel: The stomach, duodenum, small bowel and terminal ileum are grossly normal. No acute inflammatory process, mass lesions or obstructive  findings. No obvious colonic lesions to suggest a colonic primary. The appendix is normal. Vascular/Lymphatic: The aorta and branch vessels are patent. Scattered atherosclerotic calcifications. 2.2 cm the celiac axis lymph node on image 30/3. There are scattered borderline retroperitoneal lymph nodes without overt adenopathy. Nodular mesenteric implant on image 55/3 measures 2.1 cm. Mesenteric implant on image 57/3 measures 18 mm. Reproductive: Calcified right-sided fibroid. There is also a E 3.7 cm mass which could be a partially degenerated fibroid. Could not exclude the possibility of endometrial cancer. Pelvic ultrasound or MRI pelvis may be helpful for further evaluation. No adnexal masses. Prominent gonadal veins bilaterally. Other: Small amount of free pelvic fluid is noted. No inguinal adenopathy. Musculoskeletal: No significant bony findings. IMPRESSION: 1. Diffuse hepatic metastatic disease. 2. 2.6 cm lesion in the pancreatic tail could be the primary neoplasm. However there is a second lesion in the pancreatic body/tail junction region and this could reflect metastatic disease also. 3. Celiac axis lymphadenopathy and mesenteric implants. 4. Borderline retroperitoneal lymph nodes. 5. 3.7 cm mass in the uterus could be a partially degenerated fibroid. Could not exclude the possibility of any medial cancer. PET-CT may be helpful to evaluate all of the above findings and to potentially guide a safe and appropriate biopsy. Electronically Signed   By: Marijo Sanes M.D.   On: 02/11/2021 16:07   US BIOPSY (LIVER)  Result Date: 02/13/2021 INDICATION: Liver metastases, suspect pancreas primary by imaging. EXAM: ULTRASOUND CORE BIOPSY RIGHT LIVER METASTASIS MEDICATIONS: 1% LIDOCAINE LOCAL ANESTHESIA/SEDATION: Moderate (conscious) sedation was employed during this procedure. A total of Versed 1.0 mg and Fentanyl 25 mcg was administered intravenously by the radiology nurse. Total intra-service moderate Sedation  Time: 7 minutes. The patient's level of consciousness and vital signs were monitored continuously by radiology nursing throughout  the procedure under my direct supervision. FLUOROSCOPY TIME:  Fluoroscopy Time: None. COMPLICATIONS: None immediate. PROCEDURE: Informed written consent was obtained from the patient after a thorough discussion of the procedural risks, benefits and alternatives. All questions were addressed. Maximal Sterile Barrier Technique was utilized including caps, mask, sterile gowns, sterile gloves, sterile drape, hand hygiene and skin antiseptic. A timeout was performed prior to the initiation of the procedure. Previous imaging reviewed. Preliminary ultrasound performed. A right hepatic lesion was localized in the mid axillary line through a lower intercostal space. Overlying skin marked. Under sterile conditions and local anesthesia, a 17 gauge coaxial guide needle was advanced into the lesion. Needle position confirmed with ultrasound. 18 gauge core biopsies obtained. Samples placed in formalin. Needle tract occluded with Gel-Foam. Postprocedure imaging demonstrates no hemorrhage or hematoma. Patient tolerated biopsy well. IMPRESSION: Successful ultrasound core biopsy of the right liver metastasis. Electronically Signed   By: Jerilynn Mages.  Shick M.D.   On: 02/13/2021 15:10   US Venous Img Lower Unilateral Left (DVT)  Result Date: 02/03/2021 CLINICAL DATA:  Left lower extremity pain. EXAM: Left LOWER EXTREMITY VENOUS DOPPLER ULTRASOUND TECHNIQUE: Gray-scale sonography with graded compression, as well as color Doppler and duplex ultrasound were performed to evaluate the lower extremity deep venous systems from the level of the common femoral vein and including the common femoral, femoral, profunda femoral, popliteal and calf veins including the posterior tibial, peroneal and gastrocnemius veins when visible. The superficial great saphenous vein was also interrogated. Spectral Doppler was utilized to  evaluate flow at rest and with distal augmentation maneuvers in the common femoral, femoral and popliteal veins. COMPARISON:  January 15, 2021. FINDINGS: Contralateral Common Femoral Vein: Respiratory phasicity is normal and symmetric with the symptomatic side. No evidence of thrombus. Normal compressibility. Common Femoral Vein: No evidence of thrombus. Normal compressibility, respiratory phasicity and response to augmentation. Saphenofemoral Junction: No evidence of thrombus. Normal compressibility and flow on color Doppler imaging. Profunda Femoral Vein: No evidence of thrombus. Normal compressibility and flow on color Doppler imaging. Femoral Vein: Noncompressible with no flow consistent with occlusive thrombus. Popliteal Vein: No evidence of thrombus. Normal compressibility, respiratory phasicity and response to augmentation. Calf Veins: Posterior tibial and peroneal veins are noncompressible consistent with occlusive thrombus. Superficial Great Saphenous Vein: No evidence of thrombus. Normal compressibility. Venous Reflux:  None. Other Findings:  None. IMPRESSION: Occlusive deep venous thrombosis is noted in left femoral, posterior tibial and peroneal veins. Electronically Signed   By: Marijo Conception M.D.   On: 02/03/2021 14:08   ECHOCARDIOGRAM COMPLETE  Result Date: 02/12/2021    ECHOCARDIOGRAM REPORT   Patient Name:   DENESSA CAVAN Date of Exam: 02/12/2021 Medical Rec #:  [id number]         Height:       60.0 in Accession #:    [id number]        Weight:       120.0 lb Date of Birth:  12-04-1945         BSA:          1.502 m Patient Age:    78 years          BP:           103/59 mmHg Patient Gender: F                 HR:           97 bpm. Exam Location:  Inpatient Procedure: 2D Echo Indications:  Pulmonary embolism  History:        Patient has no prior history of Echocardiogram examinations.  Sonographer:    Arlyss Gandy Referring Phys: [id number] RAVI PAHWANI IMPRESSIONS  1. Left ventricular ejection  fraction, by estimation, is 60 to 65%. The left ventricle has normal function. The left ventricle has no regional wall motion abnormalities. Left ventricular diastolic parameters were normal.  2. Right ventricular systolic function is normal. The right ventricular size is normal. There is normal pulmonary artery systolic pressure.  3. The mitral valve is normal in structure. Trivial mitral valve regurgitation. No evidence of mitral stenosis.  4. The aortic valve is tricuspid. Aortic valve regurgitation is not visualized. No aortic stenosis is present.  5. The inferior vena cava is normal in size with greater than 50% respiratory variability, suggesting right atrial pressure of 3 mmHg. Comparison(s): No prior Echocardiogram. FINDINGS  Left Ventricle: Left ventricular ejection fraction, by estimation, is 60 to 65%. The left ventricle has normal function. The left ventricle has no regional wall motion abnormalities. The left ventricular internal cavity size was normal in size. There is  no left ventricular hypertrophy. Left ventricular diastolic parameters were normal. Right Ventricle: The right ventricular size is normal. Right ventricular systolic function is normal. There is normal pulmonary artery systolic pressure. The tricuspid regurgitant velocity is 2.51 m/s, and with an assumed right atrial pressure of 3 mmHg,  the estimated right ventricular systolic pressure is 45.0 mmHg. Left Atrium: Left atrial size was normal in size. Right Atrium: Right atrial size was normal in size. Pericardium: There is no evidence of pericardial effusion. Mitral Valve: The mitral valve is normal in structure. Trivial mitral valve regurgitation. No evidence of mitral valve stenosis. Tricuspid Valve: The tricuspid valve is normal in structure. Tricuspid valve regurgitation is mild . No evidence of tricuspid stenosis. Aortic Valve: The aortic valve is tricuspid. Aortic valve regurgitation is not visualized. No aortic stenosis is  present. Aortic valve mean gradient measures 7.0 mmHg. Aortic valve peak gradient measures 13.8 mmHg. Pulmonic Valve: The pulmonic valve was normal in structure. Pulmonic valve regurgitation is not visualized. No evidence of pulmonic stenosis. Aorta: The aortic root is normal in size and structure. Venous: The inferior vena cava is normal in size with greater than 50% respiratory variability, suggesting right atrial pressure of 3 mmHg. IAS/Shunts: No atrial level shunt detected by color flow Doppler.  LEFT VENTRICLE PLAX 2D LVIDd:         3.80 cm   Diastology LVIDs:         2.70 cm   LV e' medial:    11.00 cm/s LV PW:         0.80 cm   LV E/e' medial:  9.6 LV IVS:        0.80 cm   LV e' lateral:   11.00 cm/s LVOT diam:     1.80 cm   LV E/e' lateral: 9.6 LV SV:         69 LV SV Index:   46 LVOT Area:     2.54 cm  RIGHT VENTRICLE             IVC RV Basal diam:  3.20 cm     IVC diam: 1.50 cm RV Mid diam:    2.50 cm RV S prime:     15.40 cm/s TAPSE (M-mode): 2.1 cm LEFT ATRIUM             Index  RIGHT ATRIUM           Index LA diam:        2.90 cm 1.93 cm/m   RA Area:     11.40 cm LA Vol (A2C):   30.4 ml 20.24 ml/m  RA Volume:   23.70 ml  15.78 ml/m LA Vol (A4C):   50.8 ml 33.81 ml/m LA Biplane Vol: 40.8 ml 27.16 ml/m  AORTIC VALVE AV Area (Vmax):    1.67 cm AV Area (Vmean):   1.78 cm AV Area (VTI):     1.89 cm AV Vmax:           186.00 cm/s AV Vmean:          120.000 cm/s AV VTI:            0.363 m AV Peak Grad:      13.8 mmHg AV Mean Grad:      7.0 mmHg LVOT Vmax:         122.00 cm/s LVOT Vmean:        83.900 cm/s LVOT VTI:          0.270 m LVOT/AV VTI ratio: 0.74  AORTA Ao Root diam: 2.60 cm Ao Asc diam:  2.60 cm MITRAL VALVE                TRICUSPID VALVE MV Area (PHT): 2.79 cm     TR Peak grad:   25.2 mmHg MV Decel Time: 272 msec     TR Vmax:        251.00 cm/s MV E velocity: 106.00 cm/s MV A velocity: 94.70 cm/s   SHUNTS MV E/A ratio:  1.12         Systemic VTI:  0.27 m                              Systemic Diam: 1.80 cm Kirk Ruths MD Electronically signed by Kirk Ruths MD Signature Date/Time: 02/12/2021/11:40:05 AM    Final    IR IMAGING GUIDED PORT INSERTION  Result Date: 02/24/2021 CLINICAL DATA:  Metastatic pancreas cancer, access for chemotherapy EXAM: RIGHT INTERNAL JUGULAR SINGLE LUMEN POWER PORT CATHETER INSERTION Date:  02/24/2021 02/24/2021 2:36 pm Radiologist:  Jerilynn Mages. Daryll Brod, MD Guidance:  Ultrasound and fluoroscopic MEDICATIONS: 1% lidocaine local with epinephrine ANESTHESIA/SEDATION: Versed 2.0 mg IV; Fentanyl 100 mcg IV; Moderate Sedation Time:  29 minute The patient was continuously monitored during the procedure by the interventional radiology nurse under my direct supervision. FLUOROSCOPY TIME:  1.0 minutes, 42 seconds (3 mGy) COMPLICATIONS: None immediate. CONTRAST:  None. PROCEDURE: Informed consent was obtained from the patient following explanation of the procedure, risks, benefits and alternatives. The patient understands, agrees and consents for the procedure. All questions were addressed. A time out was performed. Maximal barrier sterile technique utilized including caps, mask, sterile gowns, sterile gloves, large sterile drape, hand hygiene, and 2% chlorhexidine scrub. Under sterile conditions and local anesthesia, right internal jugular micropuncture venous access was performed. Access was performed with ultrasound. Images were obtained for documentation of the patent right internal jugular vein. A guide wire was inserted followed by a transitional dilator. This allowed insertion of a guide wire and catheter into the IVC. Measurements were obtained from the SVC / RA junction back to the right IJ venotomy site. In the right infraclavicular chest, a subcutaneous pocket was created over the second anterior rib. This was done under sterile conditions and local anesthesia. 1%  lidocaine with epinephrine was utilized for this. A 2.5 cm incision was made in the skin. Blunt  dissection was performed to create a subcutaneous pocket over the right pectoralis major muscle. The pocket was flushed with saline vigorously. There was adequate hemostasis. The port catheter was assembled and checked for leakage. The port catheter was secured in the pocket with two retention sutures. The tubing was tunneled subcutaneously to the right venotomy site and inserted into the SVC/RA junction through a valved peel-away sheath. Position was confirmed with fluoroscopy. Images were obtained for documentation. The patient tolerated the procedure well. No immediate complications. Incisions were closed in a two layer fashion with 4 - 0 Vicryl suture. Dermabond was applied to the skin. The port catheter was accessed, blood was aspirated followed by saline and heparin flushes. Needle was removed. A dry sterile dressing was applied. IMPRESSION: Ultrasound and fluoroscopically guided right internal jugular single lumen power port catheter insertion. Tip in the SVC/RA junction. Catheter ready for use. Electronically Signed   By: Jerilynn Mages.  Shick M.D.   On: 02/24/2021 14:47    All questions were answered. The patient knows to call the clinic with any problems, questions or concerns. I spent 30 minutes in the care of this patient including H and P, review of records, counseling and coordination of care.     Benay Pike, MD 03/05/2021 11:25 AM

## 2021-03-06 ENCOUNTER — Encounter: Payer: Self-pay | Admitting: Hematology and Oncology

## 2021-03-06 ENCOUNTER — Telehealth: Payer: Self-pay

## 2021-03-06 DIAGNOSIS — C259 Malignant neoplasm of pancreas, unspecified: Secondary | ICD-10-CM | POA: Diagnosis not present

## 2021-03-06 DIAGNOSIS — D6481 Anemia due to antineoplastic chemotherapy: Secondary | ICD-10-CM | POA: Diagnosis not present

## 2021-03-06 DIAGNOSIS — I82409 Acute embolism and thrombosis of unspecified deep veins of unspecified lower extremity: Secondary | ICD-10-CM | POA: Diagnosis not present

## 2021-03-06 DIAGNOSIS — E440 Moderate protein-calorie malnutrition: Secondary | ICD-10-CM | POA: Diagnosis not present

## 2021-03-06 DIAGNOSIS — R634 Abnormal weight loss: Secondary | ICD-10-CM | POA: Diagnosis not present

## 2021-03-06 DIAGNOSIS — D696 Thrombocytopenia, unspecified: Secondary | ICD-10-CM | POA: Diagnosis not present

## 2021-03-06 DIAGNOSIS — R410 Disorientation, unspecified: Secondary | ICD-10-CM | POA: Diagnosis not present

## 2021-03-06 DIAGNOSIS — I2699 Other pulmonary embolism without acute cor pulmonale: Secondary | ICD-10-CM | POA: Diagnosis not present

## 2021-03-06 DIAGNOSIS — C787 Secondary malignant neoplasm of liver and intrahepatic bile duct: Secondary | ICD-10-CM | POA: Diagnosis not present

## 2021-03-06 LAB — BPAM RBC
Blood Product Expiration Date: 202302232359
Blood Product Expiration Date: 202302282359
ISSUE DATE / TIME: 202302091149
ISSUE DATE / TIME: 202302091149
Unit Type and Rh: 600
Unit Type and Rh: 600

## 2021-03-06 LAB — TYPE AND SCREEN
ABO/RH(D): A NEG
Antibody Screen: NEGATIVE
Unit division: 0
Unit division: 0

## 2021-03-06 LAB — CANCER ANTIGEN 19-9: CA 19-9: 110249 U/mL — ABNORMAL HIGH (ref 0–35)

## 2021-03-06 NOTE — Telephone Encounter (Signed)
Patient's son, [name], called to advise that [place] will be coming to pt's home for assessment visit for services today at 2 PM. Reminded [name] that [facility] will continue to offer support as needed. Encouraged [name] and his family to call with questions and concerns.

## 2021-03-07 DIAGNOSIS — C787 Secondary malignant neoplasm of liver and intrahepatic bile duct: Secondary | ICD-10-CM | POA: Diagnosis not present

## 2021-03-07 DIAGNOSIS — C259 Malignant neoplasm of pancreas, unspecified: Secondary | ICD-10-CM | POA: Diagnosis not present

## 2021-03-07 DIAGNOSIS — D696 Thrombocytopenia, unspecified: Secondary | ICD-10-CM | POA: Diagnosis not present

## 2021-03-07 DIAGNOSIS — D6481 Anemia due to antineoplastic chemotherapy: Secondary | ICD-10-CM | POA: Diagnosis not present

## 2021-03-07 DIAGNOSIS — I82409 Acute embolism and thrombosis of unspecified deep veins of unspecified lower extremity: Secondary | ICD-10-CM | POA: Diagnosis not present

## 2021-03-07 DIAGNOSIS — E440 Moderate protein-calorie malnutrition: Secondary | ICD-10-CM | POA: Diagnosis not present

## 2021-03-08 DIAGNOSIS — C787 Secondary malignant neoplasm of liver and intrahepatic bile duct: Secondary | ICD-10-CM | POA: Diagnosis not present

## 2021-03-08 DIAGNOSIS — C259 Malignant neoplasm of pancreas, unspecified: Secondary | ICD-10-CM | POA: Diagnosis not present

## 2021-03-08 DIAGNOSIS — E440 Moderate protein-calorie malnutrition: Secondary | ICD-10-CM | POA: Diagnosis not present

## 2021-03-08 DIAGNOSIS — D696 Thrombocytopenia, unspecified: Secondary | ICD-10-CM | POA: Diagnosis not present

## 2021-03-08 DIAGNOSIS — D6481 Anemia due to antineoplastic chemotherapy: Secondary | ICD-10-CM | POA: Diagnosis not present

## 2021-03-08 DIAGNOSIS — I82409 Acute embolism and thrombosis of unspecified deep veins of unspecified lower extremity: Secondary | ICD-10-CM | POA: Diagnosis not present

## 2021-03-09 DIAGNOSIS — C787 Secondary malignant neoplasm of liver and intrahepatic bile duct: Secondary | ICD-10-CM | POA: Diagnosis not present

## 2021-03-09 DIAGNOSIS — D6481 Anemia due to antineoplastic chemotherapy: Secondary | ICD-10-CM | POA: Diagnosis not present

## 2021-03-09 DIAGNOSIS — D696 Thrombocytopenia, unspecified: Secondary | ICD-10-CM | POA: Diagnosis not present

## 2021-03-09 DIAGNOSIS — E440 Moderate protein-calorie malnutrition: Secondary | ICD-10-CM | POA: Diagnosis not present

## 2021-03-09 DIAGNOSIS — C259 Malignant neoplasm of pancreas, unspecified: Secondary | ICD-10-CM | POA: Diagnosis not present

## 2021-03-09 DIAGNOSIS — I82409 Acute embolism and thrombosis of unspecified deep veins of unspecified lower extremity: Secondary | ICD-10-CM | POA: Diagnosis not present

## 2021-03-10 DIAGNOSIS — I82409 Acute embolism and thrombosis of unspecified deep veins of unspecified lower extremity: Secondary | ICD-10-CM | POA: Diagnosis not present

## 2021-03-10 DIAGNOSIS — D6481 Anemia due to antineoplastic chemotherapy: Secondary | ICD-10-CM | POA: Diagnosis not present

## 2021-03-10 DIAGNOSIS — E440 Moderate protein-calorie malnutrition: Secondary | ICD-10-CM | POA: Diagnosis not present

## 2021-03-10 DIAGNOSIS — C259 Malignant neoplasm of pancreas, unspecified: Secondary | ICD-10-CM | POA: Diagnosis not present

## 2021-03-10 DIAGNOSIS — C787 Secondary malignant neoplasm of liver and intrahepatic bile duct: Secondary | ICD-10-CM | POA: Diagnosis not present

## 2021-03-10 DIAGNOSIS — D696 Thrombocytopenia, unspecified: Secondary | ICD-10-CM | POA: Diagnosis not present

## 2021-03-11 ENCOUNTER — Telehealth: Payer: Self-pay

## 2021-03-11 DIAGNOSIS — C259 Malignant neoplasm of pancreas, unspecified: Secondary | ICD-10-CM | POA: Diagnosis not present

## 2021-03-11 DIAGNOSIS — C787 Secondary malignant neoplasm of liver and intrahepatic bile duct: Secondary | ICD-10-CM | POA: Diagnosis not present

## 2021-03-11 DIAGNOSIS — D6481 Anemia due to antineoplastic chemotherapy: Secondary | ICD-10-CM | POA: Diagnosis not present

## 2021-03-11 DIAGNOSIS — E440 Moderate protein-calorie malnutrition: Secondary | ICD-10-CM | POA: Diagnosis not present

## 2021-03-11 DIAGNOSIS — I82409 Acute embolism and thrombosis of unspecified deep veins of unspecified lower extremity: Secondary | ICD-10-CM | POA: Diagnosis not present

## 2021-03-11 DIAGNOSIS — D696 Thrombocytopenia, unspecified: Secondary | ICD-10-CM | POA: Diagnosis not present

## 2021-03-11 NOTE — Telephone Encounter (Signed)
Pt's son [name] called regarding FMLA paperwork. Called [name] back and he states he took it to pt's PCP to be completed. Pt is now on hospice.

## 2021-03-12 ENCOUNTER — Inpatient Hospital Stay: Payer: Medicare Other

## 2021-03-12 ENCOUNTER — Inpatient Hospital Stay: Payer: Medicare Other | Admitting: Genetic Counselor

## 2021-03-12 DIAGNOSIS — C259 Malignant neoplasm of pancreas, unspecified: Secondary | ICD-10-CM

## 2021-03-12 NOTE — Progress Notes (Signed)
REFERRING PROVIDER: Lajean Manes, MD [address] [place],  [place] [address]  PRIMARY PROVIDER:  Lajean Manes, MD  PRIMARY REASON FOR VISIT:  Encounter Diagnosis  Name Primary?   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 Ms. Matlin sons and daughters-in-law were under the impression genetics blood work had already been collected and that we were meeting to discuss the results. After reaching out to Dr. Rob Hickman nurse, we learned that a sample was never collected for germline hereditary cancer genetic testing. We are currently working on coordinating testing with her children/hospice at her house. Her son picked up an [employer] this afternoon. They plan to have a hospice nurse draw her blood and a family member will bring her kit back tomorrow. We will then ship out her sample and proceed with germline genetic testing. We will contact her family for a genetic counseling session when the results are available.   Lucille Passy, MS, [facility] Genetic Counselor [email or web address] (P) [phone]

## 2021-03-13 DIAGNOSIS — C787 Secondary malignant neoplasm of liver and intrahepatic bile duct: Secondary | ICD-10-CM | POA: Diagnosis not present

## 2021-03-13 DIAGNOSIS — D696 Thrombocytopenia, unspecified: Secondary | ICD-10-CM | POA: Diagnosis not present

## 2021-03-13 DIAGNOSIS — D6481 Anemia due to antineoplastic chemotherapy: Secondary | ICD-10-CM | POA: Diagnosis not present

## 2021-03-13 DIAGNOSIS — I82409 Acute embolism and thrombosis of unspecified deep veins of unspecified lower extremity: Secondary | ICD-10-CM | POA: Diagnosis not present

## 2021-03-13 DIAGNOSIS — E440 Moderate protein-calorie malnutrition: Secondary | ICD-10-CM | POA: Diagnosis not present

## 2021-03-13 DIAGNOSIS — C259 Malignant neoplasm of pancreas, unspecified: Secondary | ICD-10-CM | POA: Diagnosis not present

## 2021-03-13 DIAGNOSIS — Z8507 Personal history of malignant neoplasm of pancreas: Secondary | ICD-10-CM | POA: Diagnosis not present

## 2021-03-14 DIAGNOSIS — C259 Malignant neoplasm of pancreas, unspecified: Secondary | ICD-10-CM | POA: Diagnosis not present

## 2021-03-14 DIAGNOSIS — D696 Thrombocytopenia, unspecified: Secondary | ICD-10-CM | POA: Diagnosis not present

## 2021-03-14 DIAGNOSIS — C787 Secondary malignant neoplasm of liver and intrahepatic bile duct: Secondary | ICD-10-CM | POA: Diagnosis not present

## 2021-03-14 DIAGNOSIS — E440 Moderate protein-calorie malnutrition: Secondary | ICD-10-CM | POA: Diagnosis not present

## 2021-03-14 DIAGNOSIS — I82409 Acute embolism and thrombosis of unspecified deep veins of unspecified lower extremity: Secondary | ICD-10-CM | POA: Diagnosis not present

## 2021-03-14 DIAGNOSIS — D6481 Anemia due to antineoplastic chemotherapy: Secondary | ICD-10-CM | POA: Diagnosis not present

## 2021-03-15 DIAGNOSIS — I82409 Acute embolism and thrombosis of unspecified deep veins of unspecified lower extremity: Secondary | ICD-10-CM | POA: Diagnosis not present

## 2021-03-15 DIAGNOSIS — D696 Thrombocytopenia, unspecified: Secondary | ICD-10-CM | POA: Diagnosis not present

## 2021-03-15 DIAGNOSIS — E440 Moderate protein-calorie malnutrition: Secondary | ICD-10-CM | POA: Diagnosis not present

## 2021-03-15 DIAGNOSIS — C787 Secondary malignant neoplasm of liver and intrahepatic bile duct: Secondary | ICD-10-CM | POA: Diagnosis not present

## 2021-03-15 DIAGNOSIS — D6481 Anemia due to antineoplastic chemotherapy: Secondary | ICD-10-CM | POA: Diagnosis not present

## 2021-03-15 DIAGNOSIS — C259 Malignant neoplasm of pancreas, unspecified: Secondary | ICD-10-CM | POA: Diagnosis not present

## 2021-03-16 DIAGNOSIS — D6481 Anemia due to antineoplastic chemotherapy: Secondary | ICD-10-CM | POA: Diagnosis not present

## 2021-03-16 DIAGNOSIS — E440 Moderate protein-calorie malnutrition: Secondary | ICD-10-CM | POA: Diagnosis not present

## 2021-03-16 DIAGNOSIS — D696 Thrombocytopenia, unspecified: Secondary | ICD-10-CM | POA: Diagnosis not present

## 2021-03-16 DIAGNOSIS — I82409 Acute embolism and thrombosis of unspecified deep veins of unspecified lower extremity: Secondary | ICD-10-CM | POA: Diagnosis not present

## 2021-03-16 DIAGNOSIS — C259 Malignant neoplasm of pancreas, unspecified: Secondary | ICD-10-CM | POA: Diagnosis not present

## 2021-03-16 DIAGNOSIS — C787 Secondary malignant neoplasm of liver and intrahepatic bile duct: Secondary | ICD-10-CM | POA: Diagnosis not present

## 2021-03-17 DIAGNOSIS — I82409 Acute embolism and thrombosis of unspecified deep veins of unspecified lower extremity: Secondary | ICD-10-CM | POA: Diagnosis not present

## 2021-03-17 DIAGNOSIS — D6481 Anemia due to antineoplastic chemotherapy: Secondary | ICD-10-CM | POA: Diagnosis not present

## 2021-03-17 DIAGNOSIS — C787 Secondary malignant neoplasm of liver and intrahepatic bile duct: Secondary | ICD-10-CM | POA: Diagnosis not present

## 2021-03-17 DIAGNOSIS — D696 Thrombocytopenia, unspecified: Secondary | ICD-10-CM | POA: Diagnosis not present

## 2021-03-17 DIAGNOSIS — E440 Moderate protein-calorie malnutrition: Secondary | ICD-10-CM | POA: Diagnosis not present

## 2021-03-17 DIAGNOSIS — C259 Malignant neoplasm of pancreas, unspecified: Secondary | ICD-10-CM | POA: Diagnosis not present

## 2021-03-18 DIAGNOSIS — D6481 Anemia due to antineoplastic chemotherapy: Secondary | ICD-10-CM | POA: Diagnosis not present

## 2021-03-18 DIAGNOSIS — D696 Thrombocytopenia, unspecified: Secondary | ICD-10-CM | POA: Diagnosis not present

## 2021-03-18 DIAGNOSIS — C787 Secondary malignant neoplasm of liver and intrahepatic bile duct: Secondary | ICD-10-CM | POA: Diagnosis not present

## 2021-03-18 DIAGNOSIS — C259 Malignant neoplasm of pancreas, unspecified: Secondary | ICD-10-CM | POA: Diagnosis not present

## 2021-03-18 DIAGNOSIS — E440 Moderate protein-calorie malnutrition: Secondary | ICD-10-CM | POA: Diagnosis not present

## 2021-03-18 DIAGNOSIS — I82409 Acute embolism and thrombosis of unspecified deep veins of unspecified lower extremity: Secondary | ICD-10-CM | POA: Diagnosis not present

## 2021-03-19 DIAGNOSIS — C787 Secondary malignant neoplasm of liver and intrahepatic bile duct: Secondary | ICD-10-CM | POA: Diagnosis not present

## 2021-03-19 DIAGNOSIS — D696 Thrombocytopenia, unspecified: Secondary | ICD-10-CM | POA: Diagnosis not present

## 2021-03-19 DIAGNOSIS — I82409 Acute embolism and thrombosis of unspecified deep veins of unspecified lower extremity: Secondary | ICD-10-CM | POA: Diagnosis not present

## 2021-03-19 DIAGNOSIS — E440 Moderate protein-calorie malnutrition: Secondary | ICD-10-CM | POA: Diagnosis not present

## 2021-03-19 DIAGNOSIS — C259 Malignant neoplasm of pancreas, unspecified: Secondary | ICD-10-CM | POA: Diagnosis not present

## 2021-03-19 DIAGNOSIS — D6481 Anemia due to antineoplastic chemotherapy: Secondary | ICD-10-CM | POA: Diagnosis not present

## 2021-03-20 ENCOUNTER — Other Ambulatory Visit: Payer: Medicare Other

## 2021-03-20 ENCOUNTER — Inpatient Hospital Stay: Payer: Medicare Other

## 2021-03-20 ENCOUNTER — Inpatient Hospital Stay: Payer: Medicare Other | Admitting: Hematology and Oncology

## 2021-03-20 DIAGNOSIS — E440 Moderate protein-calorie malnutrition: Secondary | ICD-10-CM | POA: Diagnosis not present

## 2021-03-20 DIAGNOSIS — I82409 Acute embolism and thrombosis of unspecified deep veins of unspecified lower extremity: Secondary | ICD-10-CM | POA: Diagnosis not present

## 2021-03-20 DIAGNOSIS — D696 Thrombocytopenia, unspecified: Secondary | ICD-10-CM | POA: Diagnosis not present

## 2021-03-20 DIAGNOSIS — D6481 Anemia due to antineoplastic chemotherapy: Secondary | ICD-10-CM | POA: Diagnosis not present

## 2021-03-20 DIAGNOSIS — C259 Malignant neoplasm of pancreas, unspecified: Secondary | ICD-10-CM | POA: Diagnosis not present

## 2021-03-20 DIAGNOSIS — C787 Secondary malignant neoplasm of liver and intrahepatic bile duct: Secondary | ICD-10-CM | POA: Diagnosis not present

## 2021-03-23 DIAGNOSIS — D696 Thrombocytopenia, unspecified: Secondary | ICD-10-CM | POA: Diagnosis not present

## 2021-03-23 DIAGNOSIS — C259 Malignant neoplasm of pancreas, unspecified: Secondary | ICD-10-CM | POA: Diagnosis not present

## 2021-03-23 DIAGNOSIS — I82409 Acute embolism and thrombosis of unspecified deep veins of unspecified lower extremity: Secondary | ICD-10-CM | POA: Diagnosis not present

## 2021-03-23 DIAGNOSIS — C787 Secondary malignant neoplasm of liver and intrahepatic bile duct: Secondary | ICD-10-CM | POA: Diagnosis not present

## 2021-03-23 DIAGNOSIS — D6481 Anemia due to antineoplastic chemotherapy: Secondary | ICD-10-CM | POA: Diagnosis not present

## 2021-03-23 DIAGNOSIS — E440 Moderate protein-calorie malnutrition: Secondary | ICD-10-CM | POA: Diagnosis not present

## 2021-03-24 DIAGNOSIS — D6481 Anemia due to antineoplastic chemotherapy: Secondary | ICD-10-CM | POA: Diagnosis not present

## 2021-03-24 DIAGNOSIS — C259 Malignant neoplasm of pancreas, unspecified: Secondary | ICD-10-CM | POA: Diagnosis not present

## 2021-03-24 DIAGNOSIS — E440 Moderate protein-calorie malnutrition: Secondary | ICD-10-CM | POA: Diagnosis not present

## 2021-03-24 DIAGNOSIS — I82409 Acute embolism and thrombosis of unspecified deep veins of unspecified lower extremity: Secondary | ICD-10-CM | POA: Diagnosis not present

## 2021-03-24 DIAGNOSIS — C787 Secondary malignant neoplasm of liver and intrahepatic bile duct: Secondary | ICD-10-CM | POA: Diagnosis not present

## 2021-03-24 DIAGNOSIS — D696 Thrombocytopenia, unspecified: Secondary | ICD-10-CM | POA: Diagnosis not present

## 2021-03-25 DEATH — deceased

## 2021-03-27 ENCOUNTER — Other Ambulatory Visit: Payer: Medicare Other

## 2021-03-27 ENCOUNTER — Inpatient Hospital Stay: Payer: Medicare Other

## 2021-04-03 ENCOUNTER — Telehealth: Payer: Self-pay | Admitting: Genetic Counselor

## 2021-04-03 NOTE — Telephone Encounter (Signed)
I contacted Ms. Roselle's sons to discuss her genetic testing results. No pathogenic variants were identified in the 77 genes analyzed.  ? ?The test report has been scanned into EPIC and is located under the Molecular Pathology section of the Results Review tab.  A portion of the result report is included below for reference.  ? ?Lucille Passy, MS, [place] ?Genetic Counselor ?[email or web address] ?(P) [phone] ? ? ?

## 2022-06-07 IMAGING — MG DIGITAL DIAGNOSTIC BILAT W/ TOMO W/ CAD
8 series · 8 of 24 positions shown · non-contrast
Comparison: Previous exam(s).

CLINICAL DATA: Diffuse right breast pain.

EXAM:
DIGITAL DIAGNOSTIC BILATERAL MAMMOGRAM WITH TOMOSYNTHESIS AND CAD
TECHNIQUE: Bilateral digital diagnostic mammography and breast tomosynthesis
was performed. The images were evaluated with computer-aided
detection.

[L CC synth-2D]
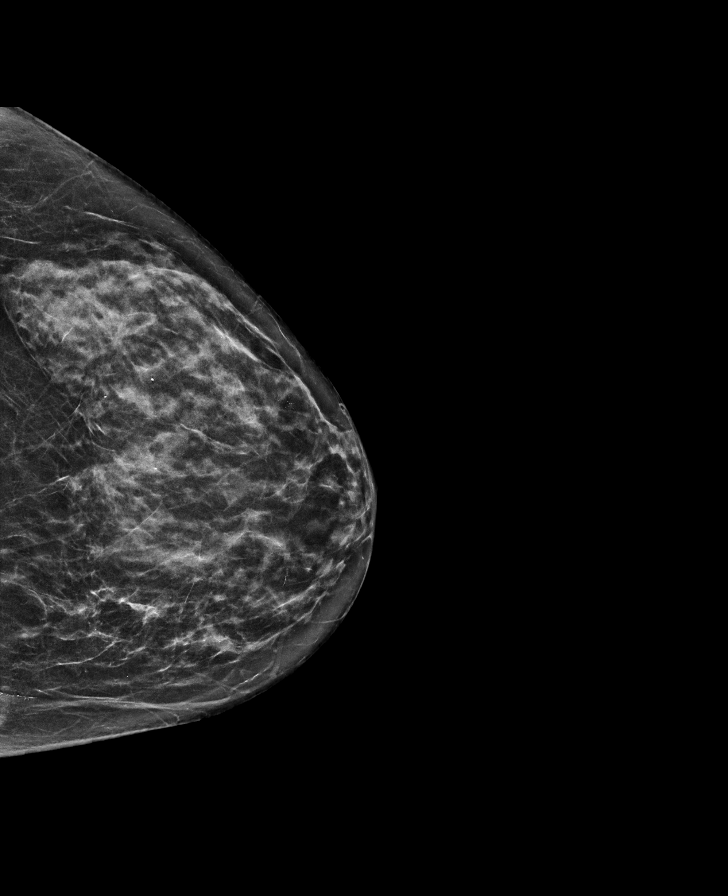

[R CC synth-2D]
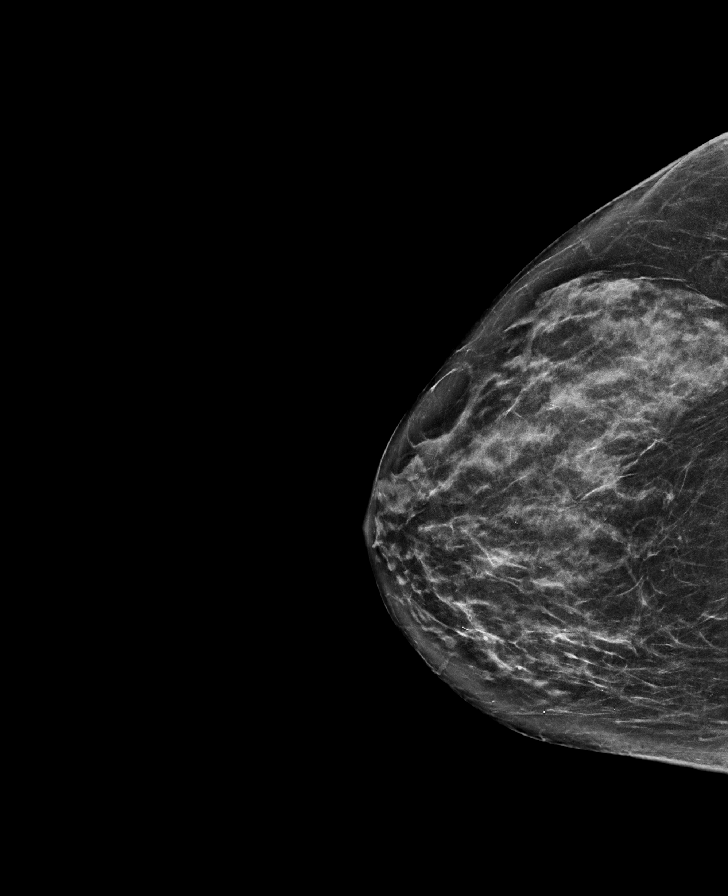

[R MLO synth-2D]
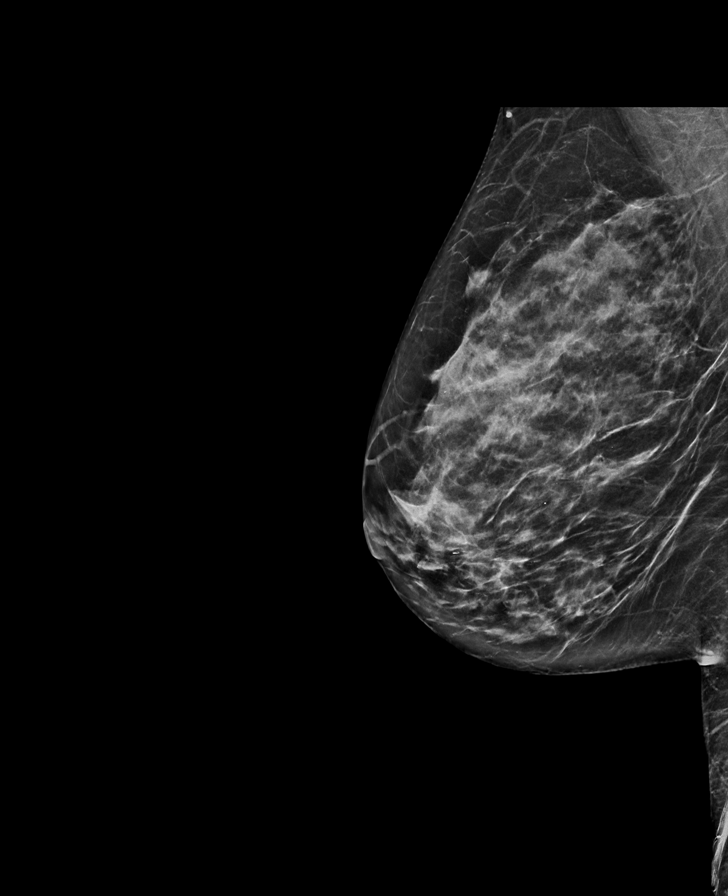

[L MLO synth-2D]
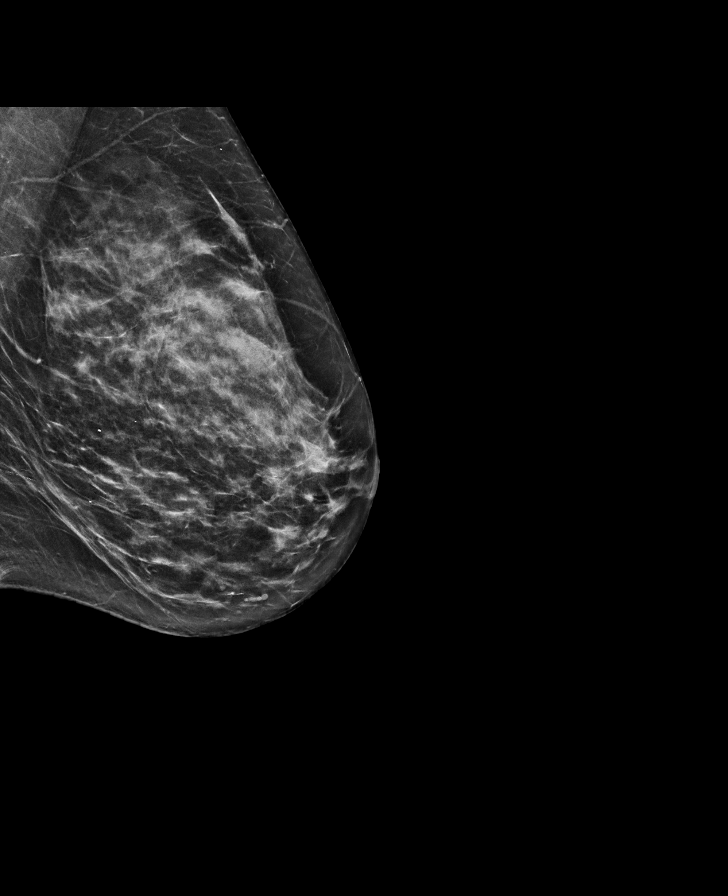

[R CC tomo · tomo slice 33/64.0]
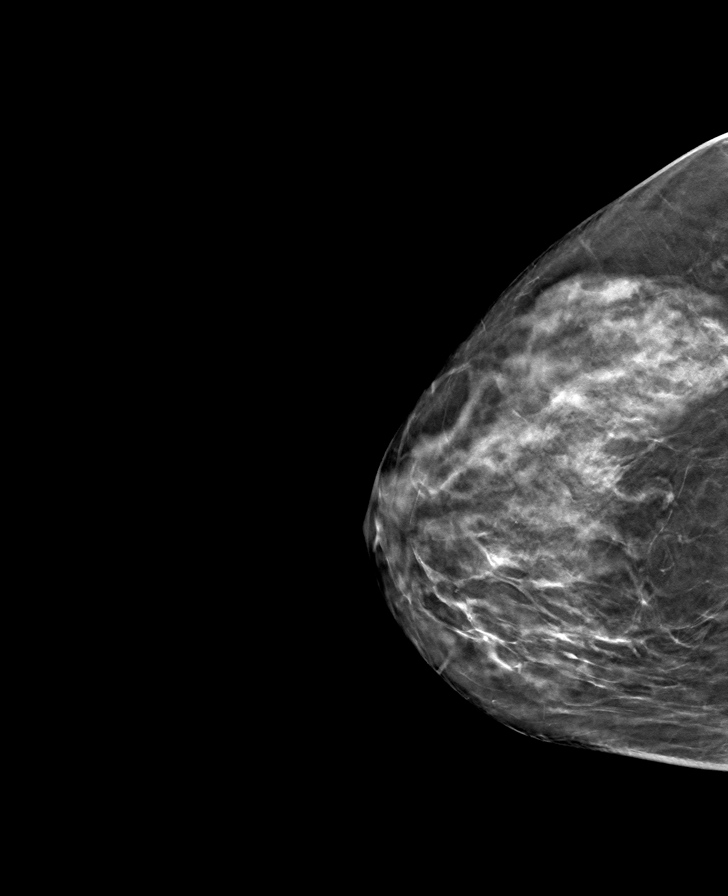

[L CC tomo · tomo slice 31/60.0]
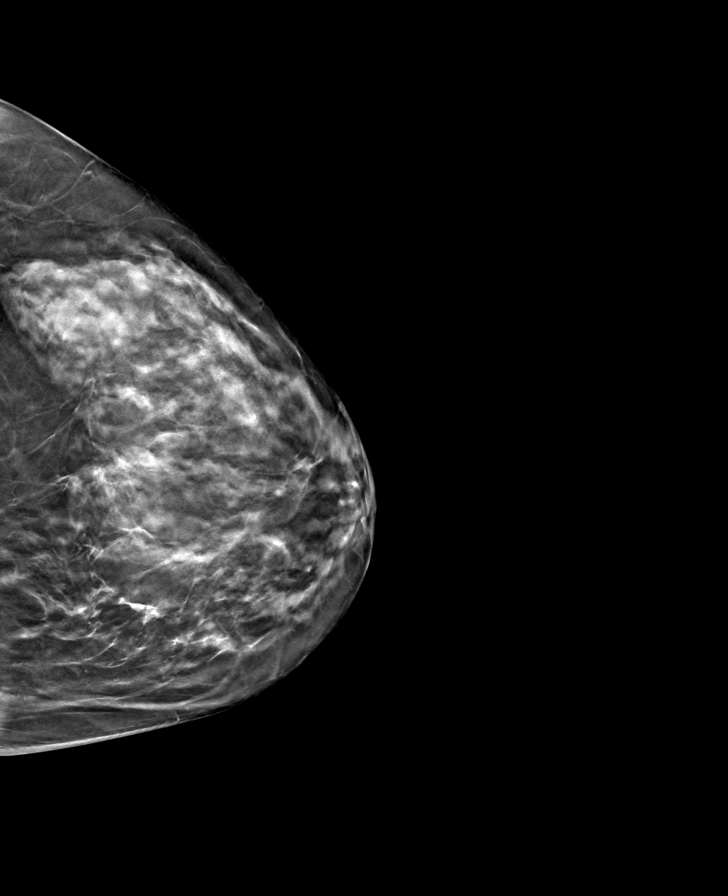

[L MLO tomo · tomo slice 32/63.0]
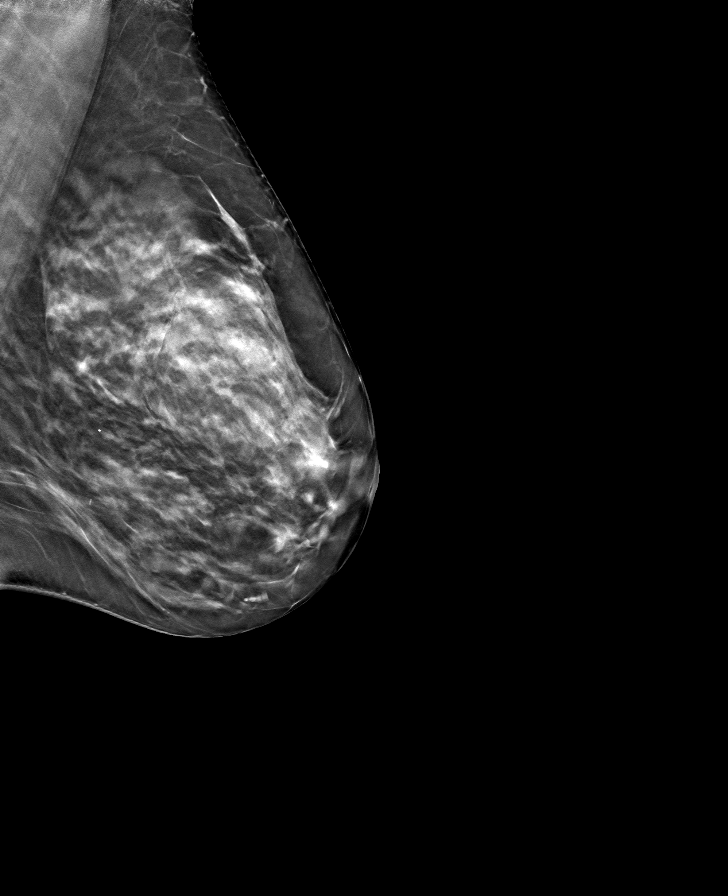

[R MLO tomo · tomo slice 33/64.0]
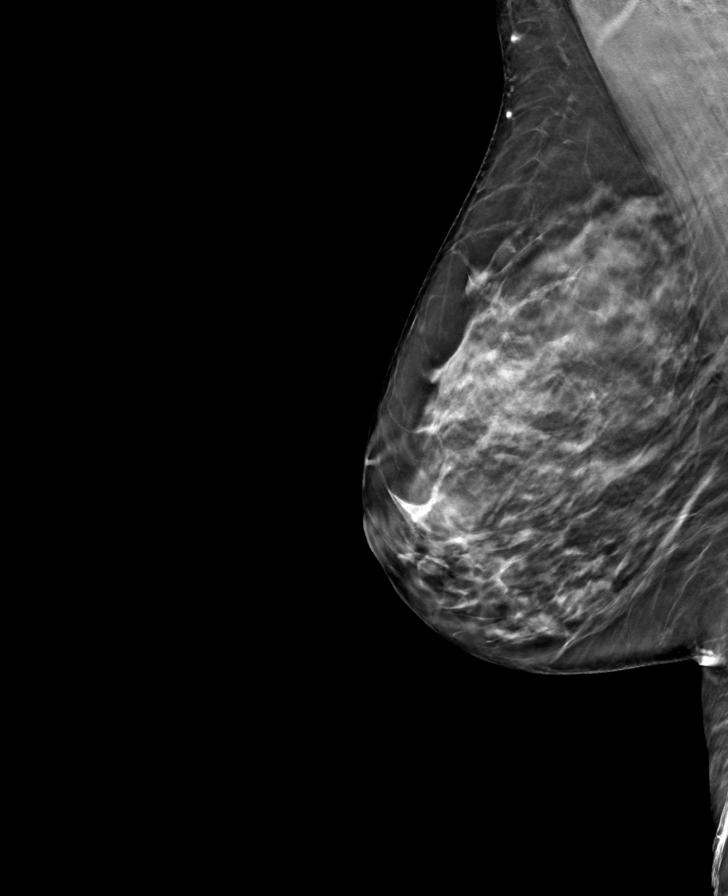

[8 of 24 positions shown; findings below may reference images not displayed]

ACR Breast Density Category c: The breast tissue is heterogeneously
dense, which may obscure small masses.
FINDINGS: No suspicious masses, calcifications, or distortion are identified
in either breast.
IMPRESSION: No mammographic evidence of malignancy.

RECOMMENDATION:
Annual screening mammography.

I have discussed the findings and recommendations with the patient.
If applicable, a reminder letter will be sent to the patient
regarding the next appointment.

BI-RADS CATEGORY  2: Benign.
